# Patient Record
Sex: Male | Born: 1965
Health system: Southern US, Community
[De-identification: ages and names within clinical notes are randomized; demographics above are authoritative.]

## PROBLEM LIST (undated history)

## (undated) DIAGNOSIS — E291 Testicular hypofunction: Secondary | ICD-10-CM

## (undated) DIAGNOSIS — E785 Hyperlipidemia, unspecified: Secondary | ICD-10-CM

## (undated) DIAGNOSIS — Z87442 Personal history of urinary calculi: Secondary | ICD-10-CM

## (undated) HISTORY — DX: Hyperlipidemia, unspecified: E78.5

## (undated) HISTORY — DX: Testicular hypofunction: E29.1

## (undated) HISTORY — PX: OTHER SURGICAL HISTORY: SHX169

---

## 2012-07-09 ENCOUNTER — Other Ambulatory Visit: Payer: Self-pay | Admitting: *Deleted

## 2012-08-01 ENCOUNTER — Other Ambulatory Visit: Payer: Self-pay

## 2012-08-01 MED ORDER — ATORVASTATIN CALCIUM 40 MG PO TABS: 40.0000 mg | ORAL_TABLET | Freq: Every day | ORAL | Status: DC

## 2012-08-01 NOTE — Telephone Encounter (Signed)
.  .  .        xx

## 2012-08-30 ENCOUNTER — Telehealth: Payer: Self-pay | Admitting: Family Medicine

## 2012-08-30 ENCOUNTER — Ambulatory Visit: Payer: Self-pay | Admitting: Family Medicine

## 2012-08-30 NOTE — Telephone Encounter (Signed)
Left message for patient that a rescheduled appointment has been made for Monday morning at 8 am, if this is not convenient for him to call the office back and ask for me.

## 2012-08-30 NOTE — Telephone Encounter (Signed)
Call back from patient. Appointment time on Monday is good.

## 2012-09-03 ENCOUNTER — Encounter: Payer: Self-pay | Admitting: Family Medicine

## 2012-09-03 ENCOUNTER — Ambulatory Visit (INDEPENDENT_AMBULATORY_CARE_PROVIDER_SITE_OTHER): Payer: BC Managed Care – PPO | Admitting: Family Medicine

## 2012-09-03 VITALS — BP 128/77 | HR 70 | Temp 98.1°F | Wt 152.2 lb

## 2012-09-03 DIAGNOSIS — E785 Hyperlipidemia, unspecified: Secondary | ICD-10-CM

## 2012-09-03 DIAGNOSIS — R5381 Other malaise: Secondary | ICD-10-CM | POA: Insufficient documentation

## 2012-09-03 DIAGNOSIS — R5383 Other fatigue: Secondary | ICD-10-CM | POA: Insufficient documentation

## 2012-09-03 DIAGNOSIS — E782 Mixed hyperlipidemia: Secondary | ICD-10-CM | POA: Insufficient documentation

## 2012-09-03 LAB — POCT CBC
Granulocyte percent: 69 %G (ref 37–80)
HCT, POC: 44.1 % (ref 43.5–53.7)
Hemoglobin: 15.4 g/dL (ref 14.1–18.1)
Lymph, poc: 1.2 (ref 0.6–3.4)
MCH, POC: 30.3 pg (ref 27–31.2)
MCHC: 35 g/dL (ref 31.8–35.4)
MCV: 86.7 fL (ref 80–97)
MPV: 7.6 fL (ref 0–99.8)
POC Granulocyte: 3.1 (ref 2–6.9)
POC LYMPH PERCENT: 26.1 %L (ref 10–50)
Platelet Count, POC: 175 10*3/uL (ref 142–424)
RBC: 5.1 M/uL (ref 4.69–6.13)
RDW, POC: 13.9 %
WBC: 4.5 10*3/uL — AB (ref 4.6–10.2)

## 2012-09-03 LAB — COMPLETE METABOLIC PANEL WITH GFR
ALT: 26 U/L (ref 0–53)
AST: 19 U/L (ref 0–37)
Albumin: 3.9 g/dL (ref 3.5–5.2)
Alkaline Phosphatase: 76 U/L (ref 39–117)
BUN: 13 mg/dL (ref 6–23)
CO2: 29 mEq/L (ref 19–32)
Calcium: 9.7 mg/dL (ref 8.4–10.5)
Chloride: 104 mEq/L (ref 96–112)
Creat: 1.06 mg/dL (ref 0.50–1.35)
GFR, Est African American: >89 mL/min
GFR, Est Non African American: 84 mL/min
Glucose, Bld: 99 mg/dL (ref 70–99)
Potassium: 4.2 mEq/L (ref 3.5–5.3)
Sodium: 141 mEq/L (ref 135–145)
Total Bilirubin: 0.5 mg/dL (ref 0.3–1.2)
Total Protein: 6.7 g/dL (ref 6.0–8.3)

## 2012-09-03 LAB — TSH: TSH: 0.969 u[IU]/mL (ref 0.350–4.500)

## 2012-09-03 MED ORDER — PRAVASTATIN SODIUM 40 MG PO TABS: 40.0000 mg | ORAL_TABLET | Freq: Every day | ORAL | Status: DC

## 2012-09-03 NOTE — Progress Notes (Signed)
Patient ID: Jesse Thomas, male   DOB: Jul 08, 1965, 47 y.o.   MRN: [id number] BP 128/77  Pulse 70  Temp(Src) 98.1 F (36.7 C) (Oral)  Wt 152 lb 3.2 oz (69.037 kg) Male  Emmakate Hypes P. [place]esto Charon, M.D.

## 2012-09-03 NOTE — Progress Notes (Signed)
Patient ID: Jesse Thomas, male   DOB: 12-12-1965, 47 y.o.   MRN: [id number] SUBJECTIVE: HPI: Patient is here for follow up of hyperlipidemia:  denies Headache;denies Chest Pain;denies weakness;denies Shortness of Breath and orthopnea;denies Visual changes;denies palpitations;denies cough;denies pedal edema;denies symptoms of TIA or stroke;deniesClaudication symptoms. admits to Compliance with medications; Problems with medications.low libido  Stress at work.    PMH/PSH: reviewed/updated in Epic  SH/FH: reviewed/updated in Epic  Allergies: reviewed/updated in Epic  Medications: reviewed/updated in Epic  Immunizations: reviewed/updated in Epic  ROS: As above in the HPI. All other systems are stable or negative.  OBJECTIVE: APPEARANCE:  Patient in no acute distress.The patient appeared well nourished and normally developed. Acyanotic. Waist: VITAL SIGNS:  SKIN: warm and  Dry without overt rashes, tattoos and scars  HEAD and Neck: without JVD, Head and scalp: normal Eyes:No scleral icterus. Fundi normal, eye movements normal. Ears: Auricle normal, canal normal, Tympanic membranes normal, insufflation normal. Nose: normal Throat: normal Neck & thyroid: normal  CHEST & LUNGS: Chest wall: normal Lungs: Clear  CVS: Reveals the PMI to be normally located. Regular rhythm, First and Second Heart sounds are normal,  absence of murmurs, rubs or gallops. Peripheral vasculature: Radial pulses: normal Dorsal pedis pulses: normal Posterior pulses: normal  ABDOMEN:  Appearance: normal Benign, no organomegaly, no masses, no Abdominal Aortic enlargement. No Guarding , no rebound. No Bruits. Bowel sounds: normal  RECTAL: N/A GU: N/A  EXTREMETIES: nonedematous. Both Femoral and Pedal pulses are normal.  MUSCULOSKELETAL:  Spine: normal Joints: intact  NEUROLOGIC: oriented to time,place and person; nonfocal. Strength is normal Sensory is normal Reflexes are  normal Cranial Nerves are normal.  ASSESSMENT: HLD (hyperlipidemia) - Plan: pravastatin (PRAVACHOL) 40 MG tablet, COMPLETE METABOLIC PANEL WITH GFR, NMR Lipoprofile with Lipids, POCT CBC  Other malaise and fatigue - Plan: Testosterone, Total & Free Direct, TSH  PLAN:  Orders Placed This Encounter  Procedures  . COMPLETE METABOLIC PANEL WITH GFR  . NMR Lipoprofile with Lipids  . Testosterone, Total & Free Direct  . TSH  . POCT CBC   Meds ordered this encounter  Medications  . fish oil-omega-3 fatty acids 1000 MG capsule    Sig: Take 2 g by mouth daily.  . pravastatin (PRAVACHOL) 40 MG tablet    Sig: Take 1 tablet (40 mg total) by mouth daily.    Dispense:  30 tablet    Refill:  5   Start on aspirin 81 mg daily Changed his  Statin to see if symptoms of fatigue improves.  Consider Co Q 10.      Dr Woodroe Mode Recommendations  Diet and Exercise discussed with patient.  For nutrition information, I recommend books:  1).Eat to Live by Dr Monico Hoar. 2).Prevent and Reverse Heart Disease by Dr Suzzette Righter. 3) Dr Katherina Right Book: Reversing Diabetes  Exercise recommendations are:  If unable to walk, then the patient can exercise in a chair 3 times a day. By flapping arms like a bird gently and raising legs outwards to the front.  If ambulatory, the patient can go for walks for 30 minutes 3 times a week. Then increase the intensity and duration as tolerated.  Goal is to try to attain exercise frequency to 5 times a week.  If applicable: Best to perform resistance exercises (machines or weights) 2 days a week and cardio type exercises 3 days per week.  Return in about 4 months (around 01/03/2013) for Recheck medical problems.  Hamsa Laurich P. [place] Charon, M.D.

## 2012-09-03 NOTE — Patient Instructions (Signed)
      Dr Charmain Diosdado's Recommendations  Diet and Exercise discussed with patient.  For nutrition information, I recommend books:  1).Eat to Live by Dr Joel Fuhrman. 2).Prevent and Reverse Heart Disease by Dr Caldwell Esselstyn. 3) Dr Neal Barnard's Book: Reversing Diabetes  Exercise recommendations are:  If unable to walk, then the patient can exercise in a chair 3 times a day. By flapping arms like a bird gently and raising legs outwards to the front.  If ambulatory, the patient can go for walks for 30 minutes 3 times a week. Then increase the intensity and duration as tolerated.  Goal is to try to attain exercise frequency to 5 times a week.  If applicable: Best to perform resistance exercises (machines or weights) 2 days a week and cardio type exercises 3 days per week.  

## 2012-09-04 LAB — NMR LIPOPROFILE WITH LIPIDS
Cholesterol, Total: 113 mg/dL (ref <200)
HDL Particle Number: 28.2 umol/L — ABNORMAL LOW (ref >=30.5)
HDL Size: 8.5 nm — ABNORMAL LOW (ref >=9.2)
HDL-C: 34 mg/dL — ABNORMAL LOW (ref >=40)
LDL (calc): 40 mg/dL (ref <100)
LDL Particle Number: 922 nmol/L (ref <1000)
LDL Size: 20.4 nm — ABNORMAL LOW (ref >20.5)
LP-IR Score: 82 — ABNORMAL HIGH (ref <=45)
Large HDL-P: 1.4 umol/L — ABNORMAL LOW (ref >=4.8)
Large VLDL-P: 6.7 nmol/L — ABNORMAL HIGH (ref <=2.7)
Small LDL Particle Number: 535 nmol/L — ABNORMAL HIGH (ref <=527)
Triglycerides: 195 mg/dL — ABNORMAL HIGH (ref <150)
VLDL Size: 54.4 nm — ABNORMAL HIGH (ref <=46.6)

## 2012-09-05 ENCOUNTER — Telehealth: Payer: Self-pay | Admitting: Family Medicine

## 2012-09-05 ENCOUNTER — Other Ambulatory Visit: Payer: Self-pay | Admitting: Family Medicine

## 2012-09-05 ENCOUNTER — Encounter: Payer: Self-pay | Admitting: Family Medicine

## 2012-09-05 DIAGNOSIS — E291 Testicular hypofunction: Secondary | ICD-10-CM

## 2012-09-05 LAB — TESTOSTERONE, TOTAL AND FREE DIRECT MEASURE
Free Testosterone, Direct: 2.9 pg/mL — ABNORMAL LOW (ref 3.8–34.2)
Testosterone: 269 ng/dL — ABNORMAL LOW (ref 300–890)

## 2012-09-06 ENCOUNTER — Telehealth: Payer: Self-pay | Admitting: Family Medicine

## 2012-09-06 NOTE — Telephone Encounter (Signed)
Pt notified via phone 

## 2012-09-06 NOTE — Telephone Encounter (Signed)
Pt notified and copy labs mailed at his request

## 2012-09-06 NOTE — Telephone Encounter (Signed)
Pt notififed  Lab results given and copy mailed to pt Duplicate mess

## 2012-09-06 NOTE — Telephone Encounter (Signed)
Pt notiifed of lab results

## 2013-01-31 ENCOUNTER — Other Ambulatory Visit: Payer: Self-pay

## 2013-05-22 ENCOUNTER — Other Ambulatory Visit: Payer: Self-pay | Admitting: Family Medicine

## 2013-05-23 NOTE — Telephone Encounter (Signed)
Patient needs to be seen. Has exceeded time since last visit. Limited quantity refilled. Needs to bring all medications to next appointment.   

## 2013-06-24 ENCOUNTER — Encounter: Payer: Self-pay | Admitting: Family Medicine

## 2013-06-24 ENCOUNTER — Ambulatory Visit (INDEPENDENT_AMBULATORY_CARE_PROVIDER_SITE_OTHER): Payer: BC Managed Care – PPO | Admitting: Family Medicine

## 2013-06-24 VITALS — BP 122/80 | HR 74 | Temp 98.1°F | Ht 66.0 in | Wt 152.6 lb

## 2013-06-24 DIAGNOSIS — E785 Hyperlipidemia, unspecified: Secondary | ICD-10-CM

## 2013-06-24 MED ORDER — PRAVASTATIN SODIUM 40 MG PO TABS: ORAL_TABLET | ORAL | Status: DC

## 2013-06-24 NOTE — Patient Instructions (Signed)
Hypercholesterolemia High Blood Cholesterol Cholesterol is a white, waxy, fat-like protein needed by your body in small amounts. The liver makes all the cholesterol you need. It is carried from the liver by the blood through the blood vessels. Deposits (plaque) may build up on blood vessel walls. This makes the arteries narrower and stiffer. Plaque increases the risk for heart attack and stroke. You cannot feel your cholesterol level even if it is very high. The only way to know is by a blood test to check your lipid (fats) levels. Once you know your cholesterol levels, you should keep a record of the test results. Work with your caregiver to to keep your levels in the desired range. WHAT THE RESULTS MEAN:  Total cholesterol is a rough measure of all the cholesterol in your blood.  LDL is the so-called bad cholesterol. This is the type that deposits cholesterol in the walls of the arteries. You want this level to be low.  HDL is the good cholesterol because it cleans the arteries and carries the LDL away. You want this level to be high.  Triglycerides are fat that the body can either burn for energy or store. High levels are closely linked to heart disease. DESIRED LEVELS:  Total cholesterol below 200.  LDL below 100 for people at risk, below 70 for very high risk.  HDL above 50 is good, above 60 is best.  Triglycerides below 150. HOW TO LOWER YOUR CHOLESTEROL:  Diet.  Choose fish or white meat chicken and [place], roasted or baked. Limit fatty cuts of red meat, fried foods, and processed meats, such as sausage and lunch meat.  Eat lots of fresh fruits and vegetables. Choose whole grains, beans, pasta, potatoes and cereals.  Use only small amounts of olive, corn or canola oils. Avoid butter, mayonnaise, shortening or palm kernel oils. Avoid foods with trans-fats.  Use skim/nonfat milk and low-fat/nonfat yogurt and cheeses. Avoid whole milk, cream, ice cream, egg yolks and cheeses.  Healthy desserts include angel food cake, gingersnaps, animal crackers, hard candy, popsicles, and low-fat/nonfat frozen yogurt. Avoid pastries, cakes, pies and cookies.  Exercise.  A regular program helps decrease LDL and raises HDL.  Helps with weight control.  Do things that increase your activity level like gardening, walking, or taking the stairs.  Medication.  May be prescribed by your caregiver to help lowering cholesterol and the risk for heart disease.  You may need medicine even if your levels are normal if you have several risk factors. HOME CARE INSTRUCTIONS   Follow your diet and exercise programs as suggested by your caregiver.  Take medications as directed.  Have blood work done when your caregiver feels it is necessary. MAKE SURE YOU:   Understand these instructions.  Will watch your condition.  Will get help right away if you are not doing well or get worse. Document Released: 03/14/2005 Document Revised: 06/06/2011 Document Reviewed: 08/30/2006 [facility] Patient Information 2014 [place].        Dr Paula Libra Recommendations  For nutrition information, I recommend books:  1).Eat to Live by Dr Excell Seltzer. 2).Prevent and Reverse Heart Disease by Dr Karl Luke. 3) Dr Janene Harvey Book:  Program to Reverse Diabetes  Exercise recommendations are:  If unable to walk, then the patient can exercise in a chair 3 times a day. By flapping arms like a bird gently and raising legs outwards to the front.  If ambulatory, the patient can go for walks for 30 minutes 3 times  week. Then increase the intensity and duration as tolerated.  Goal is to try to attain exercise frequency to 5 times a week.  If applicable: Best to perform resistance exercises (machines or weights) 2 days a week and cardio type exercises 3 days per week.  

## 2013-06-24 NOTE — Progress Notes (Signed)
Patient ID: Jesse Thomas, male   DOB: 1965-10-25, 48 y.o.   MRN: [id number] SUBJECTIVE: CC: Chief Complaint  Patient presents with  . Follow-up    needs refills    HPI: Patient is here for follow up of hyperlipidemia: denies Headache;denies Chest Pain;denies weakness;denies Shortness of Breath and orthopnea;denies Visual changes;denies palpitations;denies cough;denies pedal edema;denies symptoms of TIA or stroke;deniesClaudication symptoms. admits to Compliance with medications; denies Problems with medications.  Has been out of medications for 2 weeks. Past Medical History  Diagnosis Date  . Hyperlipidemia    No past surgical history on file. History   Social History  . Marital Status: Married    Spouse Name: N/A    Number of Children: N/A  . Years of Education: N/A   Occupational History  . Not on file.   Social History Main Topics  . Smoking status: Never Smoker   . Smokeless tobacco: Not on file  . Alcohol Use: Not on file  . Drug Use: Not on file  . Sexual Activity: Not on file   Other Topics Concern  . Not on file   Social History Narrative  . No narrative on file   No family history on file. Current Outpatient Prescriptions on File Prior to Visit  Medication Sig Dispense Refill  . fish oil-omega-3 fatty acids 1000 MG capsule Take 2 g by mouth daily.       No current facility-administered medications on file prior to visit.   No Known Allergies  There is no immunization history on file for this patient. Prior to Admission medications   Medication Sig Start Date End Date Taking? Authorizing Provider  fish oil-omega-3 fatty acids 1000 MG capsule Take 2 g by mouth daily.    Historical Provider, MD  pravastatin (PRAVACHOL) 40 MG tablet TAKE ONE TABLET BY MOUTH ONCE DAILY    Vernie Shanks, MD     ROS: As above in the HPI. All other systems are stable or negative.  OBJECTIVE: APPEARANCE:  Patient in no acute distress.The patient appeared well  nourished and normally developed. Acyanotic. Waist: VITAL SIGNS:BP 122/80  Pulse 74  Temp(Src) 98.1 F (36.7 C) (Oral)  Ht _0  (1.676 m)  Wt 152 lb 9.6 oz (69.219 kg)  BMI 24.64 kg/m2  [place] Male.  SKIN: warm and  Dry without overt rashes, tattoos and scars  HEAD and Neck: without JVD, Head and scalp: normal Eyes:No scleral icterus. Fundi normal, eye movements normal. Ears: Auricle normal, canal normal, Tympanic membranes normal, insufflation normal. Nose: normal Throat: normal Neck & thyroid: normal  CHEST & LUNGS: Chest wall: normal Lungs: Clear  CVS: Reveals the PMI to be normally located. Regular rhythm, First and Second Heart sounds are normal,  absence of murmurs, rubs or gallops. Peripheral vasculature: Radial pulses: normal Dorsal pedis pulses: normal Posterior pulses: normal  ABDOMEN:  Appearance: normal Benign, no organomegaly, no masses, no Abdominal Aortic enlargement. No Guarding , no rebound. No Bruits. Bowel sounds: normal  RECTAL: N/A GU: N/A  EXTREMETIES: nonedematous.  MUSCULOSKELETAL:  Spine: normal Joints: intact  NEUROLOGIC: oriented to time,place and person; nonfocal. Strength is normal Sensory is normal Reflexes are normal Cranial Nerves are normal.  Results for orders placed in visit on 09/03/12  COMPLETE METABOLIC PANEL WITH GFR      Result Value Ref Range   Sodium 141  135 - 145 mEq/L   Potassium 4.2  3.5 - 5.3 mEq/L   Chloride 104  96 - 112 mEq/L  CO2 29  19 - 32 mEq/L   Glucose, Bld 99  70 - 99 mg/dL   BUN 13  6 - 23 mg/dL   Creat 1.06  0.50 - 1.35 mg/dL   Total Bilirubin 0.5  0.3 - 1.2 mg/dL   Alkaline Phosphatase 76  39 - 117 U/L   AST 19  0 - 37 U/L   ALT 26  0 - 53 U/L   Total Protein 6.7  6.0 - 8.3 g/dL   Albumin 3.9  3.5 - 5.2 g/dL   Calcium 9.7  8.4 - 10.5 mg/dL   GFR, Est African American >89     GFR, Est Non African American 84    NMR LIPOPROFILE WITH LIPIDS      Result Value Ref Range   LDL  Particle Number 922  <1000 nmol/L   LDL (calc) 40  <100 mg/dL   HDL-C 34 (*) >=40 mg/dL   Triglycerides 195 (*) <150 mg/dL   Cholesterol, Total 113  <200 mg/dL   HDL Particle Number 28.2 (*) >=30.5 umol/L   Large HDL-P 1.4 (*) >=4.8 umol/L   Large VLDL-P 6.7 (*) <=2.7 nmol/L   Small LDL Particle Number 535 (*) <=527 nmol/L   LDL Size 20.4 (*) >20.5 nm   HDL Size 8.5 (*) >=9.2 nm   VLDL Size 54.4 (*) <=46.6 nm   LP-IR Score 82 (*) <=45  TESTOSTERONE, TOTAL AND FREE DIRECT MEASURE      Result Value Ref Range   Testosterone 269 (*) 300 - 890 ng/dL   Free Testosterone, Direct 2.9 (*) 3.8 - 34.2 pg/mL  TSH      Result Value Ref Range   TSH 0.969  0.350 - 4.500 uIU/mL  POCT CBC      Result Value Ref Range   WBC 4.5 (*) 4.6 - 10.2 K/uL   Lymph, poc 1.2  0.6 - 3.4   POC LYMPH PERCENT 26.1  10 - 50 %L   POC Granulocyte 3.1  2 - 6.9   Granulocyte percent 69.0  37 - 80 %G   RBC 5.1  4.69 - 6.13 M/uL   Hemoglobin 15.4  14.1 - 18.1 g/dL   HCT, POC 44.1  43.5 - 53.7 %   MCV 86.7  80 - 97 fL   MCH, POC 30.3  27 - 31.2 pg   MCHC 35.0  31.8 - 35.4 g/dL   RDW, POC 13.9     Platelet Count, POC 175.0  142 - 424 K/uL   MPV 7.6  0 - 99.8 fL    ASSESSMENT:  Hyperlipidemia - Plan: pravastatin (PRAVACHOL) 40 MG tablet, CMP14+EGFR, NMR, lipoprofile  PLAN: Handout on hypercholesterolemia in the AVS.       Dr Paula Libra Recommendations  For nutrition information, I recommend books:  1).Eat to Live by Dr Excell Seltzer. 2).Prevent and Reverse Heart Disease by Dr Karl Luke. 3) Dr Janene Harvey Book:  Program to Reverse Diabetes  Exercise recommendations are:  If unable to walk, then the patient can exercise in a chair 3 times a day. By flapping arms like a bird gently and raising legs outwards to the front.  If ambulatory, the patient can go for walks for 30 minutes 3 times a week. Then increase the intensity and duration as tolerated.  Goal is to try to attain exercise  frequency to 5 times a week.  If applicable: Best to perform resistance exercises (machines or weights) 2 days a week and cardio type exercises  3 days per week.  Orders Placed This Encounter  Procedures  . CMP14+EGFR  . NMR, lipoprofile   Meds ordered this encounter  Medications  . pravastatin (PRAVACHOL) 40 MG tablet    Sig: TAKE ONE TABLET BY MOUTH ONCE DAILY    Dispense:  30 tablet    Refill:  5   Medications Discontinued During This Encounter  Medication Reason  . pravastatin (PRAVACHOL) 40 MG tablet Reorder   Return in about 4 months (around 10/24/2013) for Recheck medical problems.  Shalinda Burkholder P. Jacelyn Grip, M.D.

## 2013-06-25 LAB — NMR, LIPOPROFILE
Cholesterol: 164 mg/dL (ref <200)
HDL Cholesterol by NMR: 36 mg/dL — ABNORMAL LOW (ref >=40)
HDL Particle Number: 30.6 umol/L (ref >=30.5)
LDL Particle Number: 1389 nmol/L — ABNORMAL HIGH (ref <1000)
LDL Size: 20.6 nm (ref >20.5)
LDLC SERPL CALC-MCNC: 90 mg/dL (ref <100)
LP-IR Score: 73 — ABNORMAL HIGH (ref <=45)
Small LDL Particle Number: 761 nmol/L — ABNORMAL HIGH (ref <=527)
Triglycerides by NMR: 191 mg/dL — ABNORMAL HIGH (ref <150)

## 2013-06-25 LAB — CMP14+EGFR
ALT: 25 IU/L (ref 0–44)
AST: 19 IU/L (ref 0–40)
Albumin/Globulin Ratio: 2 (ref 1.1–2.5)
Albumin: 4.4 g/dL (ref 3.5–5.5)
Alkaline Phosphatase: 78 IU/L (ref 39–117)
BUN/Creatinine Ratio: 11 (ref 9–20)
BUN: 14 mg/dL (ref 6–24)
CO2: 26 mmol/L (ref 18–29)
Calcium: 9.4 mg/dL (ref 8.7–10.2)
Chloride: 104 mmol/L (ref 97–108)
Creatinine, Ser: 1.22 mg/dL (ref 0.76–1.27)
GFR calc Af Amer: 82 mL/min/{1.73_m2} (ref >59)
GFR calc non Af Amer: 71 mL/min/{1.73_m2} (ref >59)
Globulin, Total: 2.2 g/dL (ref 1.5–4.5)
Glucose: 91 mg/dL (ref 65–99)
Potassium: 4.1 mmol/L (ref 3.5–5.2)
Sodium: 145 mmol/L — ABNORMAL HIGH (ref 134–144)
Total Bilirubin: 0.3 mg/dL (ref 0.0–1.2)
Total Protein: 6.6 g/dL (ref 6.0–8.5)

## 2013-06-26 NOTE — Progress Notes (Signed)
Quick Note:  Call Patient Labs that are abnormal: Triglycerides and the LDL particles was a little high HDLc is low The rest are at goal  Recommendations: This is not so bad as i was expecting since he ran out of medications for 2 weeks. He needs to keep on th emedications and keep up with apppintments. Diet and exercise will always help.   ______

## 2013-06-27 ENCOUNTER — Telehealth: Payer: Self-pay | Admitting: Family Medicine

## 2013-06-27 NOTE — Telephone Encounter (Signed)
Discussed with patient and released to My Chart.

## 2013-09-04 ENCOUNTER — Ambulatory Visit (INDEPENDENT_AMBULATORY_CARE_PROVIDER_SITE_OTHER): Payer: BC Managed Care – PPO | Admitting: Family Medicine

## 2013-09-04 ENCOUNTER — Encounter: Payer: Self-pay | Admitting: Family Medicine

## 2013-09-04 VITALS — BP 121/86 | HR 69 | Temp 97.9°F | Wt 153.0 lb

## 2013-09-04 DIAGNOSIS — J029 Acute pharyngitis, unspecified: Secondary | ICD-10-CM

## 2013-09-04 MED ORDER — AZITHROMYCIN 250 MG PO TABS: ORAL_TABLET | ORAL | Status: DC

## 2013-09-04 NOTE — Progress Notes (Signed)
   Subjective:    Patient ID: Jesse Thomas, male    DOB: 04/26/1965, 48 y.o.   MRN: [id number]  HPI This 48 y.o. male presents for evaluation of uri sx's for a week.   Review of Systems No chest pain, SOB, HA, dizziness, vision change, N/V, diarrhea, constipation, dysuria, urinary urgency or frequency, myalgias, arthralgias or rash.     Objective:   Physical Exam   Vital signs noted  Well developed well nourished male.  HEENT - Head atraumatic Normocephalic                Eyes - PERRLA, Conjuctiva - clear Sclera- Clear EOMI                Ears - EAC's Wnl TM's Wnl Gross Hearing WNL                Nose - Nares patent                 Throat - oropharanx wnl Respiratory - Lungs CTA bilateral Cardiac - RRR S1 and S2 without murmur GI - Abdomen soft Nontender and bowel sounds active x 4 Extremities - No edema. Neuro - Grossly intact.     Assessment & Plan:  Acute pharyngitis - Plan: POCT rapid strep A, azithromycin (ZITHROMAX) 250 MG tablet Push po fluids, rest, tylenol and motrin otc prn as directed for fever, arthralgias, and myalgias.  Follow up prn if sx's continue or persist.  Lysbeth Penner FNP

## 2016-02-17 ENCOUNTER — Ambulatory Visit (INDEPENDENT_AMBULATORY_CARE_PROVIDER_SITE_OTHER): Payer: BLUE CROSS/BLUE SHIELD | Admitting: Family Medicine

## 2016-02-17 ENCOUNTER — Encounter: Payer: Self-pay | Admitting: Family Medicine

## 2016-02-17 VITALS — BP 102/68 | HR 61 | Temp 97.4°F | Ht 66.0 in | Wt 153.4 lb

## 2016-02-17 DIAGNOSIS — N529 Male erectile dysfunction, unspecified: Secondary | ICD-10-CM

## 2016-02-17 DIAGNOSIS — Z0000 Encounter for general adult medical examination without abnormal findings: Secondary | ICD-10-CM | POA: Diagnosis not present

## 2016-02-17 DIAGNOSIS — E782 Mixed hyperlipidemia: Secondary | ICD-10-CM

## 2016-02-17 LAB — URINALYSIS
Bilirubin, UA: NEGATIVE
GLUCOSE, UA: NEGATIVE
KETONES UA: NEGATIVE
Leukocytes, UA: NEGATIVE
Nitrite, UA: NEGATIVE
Protein, UA: NEGATIVE
Specific Gravity, UA: 1.01 (ref 1.005–1.030)
UUROB: 0.2 mg/dL (ref 0.2–1.0)
pH, UA: 7 (ref 5.0–7.5)

## 2016-02-17 MED ORDER — SILDENAFIL CITRATE 20 MG PO TABS: 20.0000 mg | ORAL_TABLET | Freq: Every day | ORAL | 5 refills | Status: DC | PRN

## 2016-02-17 NOTE — Progress Notes (Signed)
Subjective:  Patient ID: Jesse Thomas, male    DOB: 07-24-1965  Age: 50 y.o. MRN: [id number]  CC: Annual Exam   HPI Jermale Kasper presents for Annual physical exam. History of low testosterone. He decided against taking testosterone replacement due to side effects when he read up.He did not give them a trial. However he would like to give Viagra another try. He states that he had headaches when he took it in the past. Patient also did not take the pravastatin since it also was associated with side effects. He did not experience any side effects because he didn't try the medication.   History Maris has a past medical history of Hyperlipidemia and Hypogonadism male.   He has a past surgical history that includes none.   His family history includes Dementia in his mother; Early death in his father.He reports that he has never smoked. He has never used smokeless tobacco. His alcohol and drug histories are not on file.    Medication List       Accurate as of 02/17/16 11:36 AM. Always use your most recent med list.          fish oil-omega-3 fatty acids 1000 MG capsule Take 2 g by mouth daily.   sildenafil 20 MG tablet Commonly known as:  REVATIO Take 1 tablet (20 mg total) by mouth daily as needed (2-5 as needed).        ROS Review of Systems  Constitutional: Negative for activity change, appetite change, chills, diaphoresis, fatigue, fever and unexpected weight change.  HENT: Negative for congestion, ear pain, hearing loss, postnasal drip, rhinorrhea, sore throat, tinnitus and trouble swallowing.   Eyes: Negative for photophobia, pain, discharge and redness.  Respiratory: Negative for apnea, cough, choking, chest tightness, shortness of breath, wheezing and stridor.   Cardiovascular: Negative for chest pain, palpitations and leg swelling.  Gastrointestinal: Negative for abdominal distention, abdominal pain, blood in stool, constipation, diarrhea, nausea and vomiting.    Endocrine: Negative for cold intolerance, heat intolerance, polydipsia, polyphagia and polyuria.  Genitourinary: Negative for difficulty urinating, dysuria, enuresis, flank pain, frequency, genital sores, hematuria and urgency.  Musculoskeletal: Negative for arthralgias and joint swelling.  Skin: Negative for color change, rash and wound.  Allergic/Immunologic: Negative for immunocompromised state.  Neurological: Negative for dizziness, tremors, seizures, syncope, facial asymmetry, speech difficulty, weakness, light-headedness, numbness and headaches.  Hematological: Does not bruise/bleed easily.  Psychiatric/Behavioral: Negative for agitation, behavioral problems, confusion, decreased concentration, dysphoric mood, hallucinations, sleep disturbance and suicidal ideas. The patient is not nervous/anxious and is not hyperactive.     Objective:  BP 102/68   Pulse 61   Temp 97.4 F (36.3 C) (Oral)   Ht '5\' 6"'  (1.676 m)   Wt 153 lb 6 oz (69.6 kg)   BMI 24.76 kg/m   BP Readings from Last 3 Encounters:  02/17/16 102/68  09/04/13 121/86  06/24/13 122/80    Wt Readings from Last 3 Encounters:  02/17/16 153 lb 6 oz (69.6 kg)  09/04/13 153 lb (69.4 kg)  06/24/13 152 lb 9.6 oz (69.2 kg)     Physical Exam  Constitutional: He is oriented to person, place, and time. He appears well-developed and well-nourished.  HENT:  Head: Normocephalic and atraumatic.  Mouth/Throat: Oropharynx is clear and moist.  Eyes: EOM are normal. Pupils are equal, round, and reactive to light.  Neck: Normal range of motion. No tracheal deviation present. No thyromegaly present.  Cardiovascular: Normal rate, regular rhythm and normal heart sounds.  Exam reveals no gallop and no friction rub.   No murmur heard. Pulmonary/Chest: Breath sounds normal. He has no wheezes. He has no rales.  Abdominal: Soft. He exhibits no mass. There is no tenderness.  Musculoskeletal: Normal range of motion. He exhibits no edema.   Neurological: He is alert and oriented to person, place, and time.  Skin: Skin is warm and dry.  Psychiatric: He has a normal mood and affect.     Lab Results  Component Value Date   WBC 4.5 (A) 09/03/2012   HGB 15.4 09/03/2012   HCT 44.1 09/03/2012   GLUCOSE 91 06/24/2013   CHOL 164 06/24/2013   TRIG 191 (H) 06/24/2013   HDL 36 (L) 06/24/2013   LDLCALC 90 06/24/2013   ALT 25 06/24/2013   AST 19 06/24/2013   NA 145 (H) 06/24/2013   K 4.1 06/24/2013   CL 104 06/24/2013   CREATININE 1.22 06/24/2013   BUN 14 06/24/2013   CO2 26 06/24/2013   TSH 0.969 09/03/2012    Patient was never admitted.  Assessment & Plan:   Hamdan was seen today for annual exam.  Diagnoses and all orders for this visit:  Well adult exam -     CBC with Differential/Platelet -     CMP14+EGFR -     Lipid panel -     PSA Total (Reflex To Free) -     Urinalysis  Erectile dysfunction, unspecified erectile dysfunction type  Mixed hyperlipidemia  Other orders -     sildenafil (REVATIO) 20 MG tablet; Take 1 tablet (20 mg total) by mouth daily as needed (2-5 as needed).    Meds ordered this encounter  Medications  . sildenafil (REVATIO) 20 MG tablet    Sig: Take 1 tablet (20 mg total) by mouth daily as needed (2-5 as needed).    Dispense:  50 tablet    Refill:  5     Follow-up: Return in about 1 year (around 02/16/2017).  Claretta Fraise, M.D.

## 2016-02-18 LAB — CMP14+EGFR
ALK PHOS: 83 IU/L (ref 39–117)
ALT: 24 IU/L (ref 0–44)
AST: 19 IU/L (ref 0–40)
Albumin/Globulin Ratio: 1.6 (ref 1.2–2.2)
Albumin: 4.5 g/dL (ref 3.5–5.5)
BILIRUBIN TOTAL: 0.7 mg/dL (ref 0.0–1.2)
BUN/Creatinine Ratio: 13 (ref 9–20)
BUN: 15 mg/dL (ref 6–24)
CHLORIDE: 101 mmol/L (ref 96–106)
CO2: 27 mmol/L (ref 18–29)
Calcium: 9.5 mg/dL (ref 8.7–10.2)
Creatinine, Ser: 1.15 mg/dL (ref 0.76–1.27)
GFR calc Af Amer: 87 mL/min/{1.73_m2} (ref >59)
GFR calc non Af Amer: 75 mL/min/{1.73_m2} (ref >59)
GLOBULIN, TOTAL: 2.8 g/dL (ref 1.5–4.5)
Glucose: 79 mg/dL (ref 65–99)
POTASSIUM: 4.2 mmol/L (ref 3.5–5.2)
Sodium: 143 mmol/L (ref 134–144)
Total Protein: 7.3 g/dL (ref 6.0–8.5)

## 2016-02-18 LAB — LIPID PANEL
CHOLESTEROL TOTAL: 210 mg/dL — AB (ref 100–199)
Chol/HDL Ratio: 5.4 ratio units — ABNORMAL HIGH (ref 0.0–5.0)
HDL: 39 mg/dL — ABNORMAL LOW (ref >39)
LDL Calculated: 150 mg/dL — ABNORMAL HIGH (ref 0–99)
Triglycerides: 107 mg/dL (ref 0–149)
VLDL Cholesterol Cal: 21 mg/dL (ref 5–40)

## 2016-02-18 LAB — CBC WITH DIFFERENTIAL/PLATELET
BASOS ABS: 0 10*3/uL (ref 0.0–0.2)
Basos: 1 %
EOS (ABSOLUTE): 0.2 10*3/uL (ref 0.0–0.4)
Eos: 5 %
HEMOGLOBIN: 15 g/dL (ref 12.6–17.7)
Hematocrit: 41.7 % (ref 37.5–51.0)
Immature Grans (Abs): 0 10*3/uL (ref 0.0–0.1)
Immature Granulocytes: 0 %
LYMPHS ABS: 1 10*3/uL (ref 0.7–3.1)
Lymphs: 33 %
MCH: 29.7 pg (ref 26.6–33.0)
MCHC: 36 g/dL — AB (ref 31.5–35.7)
MCV: 83 fL (ref 79–97)
Monocytes Absolute: 0.4 10*3/uL (ref 0.1–0.9)
Monocytes: 14 %
NEUTROS ABS: 1.5 10*3/uL (ref 1.4–7.0)
Neutrophils: 47 %
Platelets: 171 10*3/uL (ref 150–379)
RBC: 5.05 x10E6/uL (ref 4.14–5.80)
RDW: 14.5 % (ref 12.3–15.4)
WBC: 3.1 10*3/uL — ABNORMAL LOW (ref 3.4–10.8)

## 2016-02-18 LAB — PSA TOTAL (REFLEX TO FREE): Prostate Specific Ag, Serum: 1.1 ng/mL (ref 0.0–4.0)

## 2016-02-19 ENCOUNTER — Other Ambulatory Visit: Payer: Self-pay | Admitting: *Deleted

## 2016-02-19 ENCOUNTER — Telehealth: Payer: Self-pay | Admitting: Family Medicine

## 2016-02-19 DIAGNOSIS — R319 Hematuria, unspecified: Secondary | ICD-10-CM

## 2016-02-19 NOTE — Telephone Encounter (Signed)
Patient aware of results and are viewable via MyChart

## 2016-02-23 ENCOUNTER — Telehealth: Payer: Self-pay | Admitting: Family Medicine

## 2016-02-23 NOTE — Telephone Encounter (Signed)
We will release this

## 2016-08-04 DIAGNOSIS — M25562 Pain in left knee: Secondary | ICD-10-CM | POA: Diagnosis not present

## 2016-08-13 DIAGNOSIS — M25562 Pain in left knee: Secondary | ICD-10-CM | POA: Diagnosis not present

## 2016-09-01 DIAGNOSIS — M25562 Pain in left knee: Secondary | ICD-10-CM | POA: Diagnosis not present

## 2016-09-01 DIAGNOSIS — S83241D Other tear of medial meniscus, current injury, right knee, subsequent encounter: Secondary | ICD-10-CM | POA: Diagnosis not present

## 2016-11-01 DIAGNOSIS — G44219 Episodic tension-type headache, not intractable: Secondary | ICD-10-CM | POA: Diagnosis not present

## 2016-11-12 DIAGNOSIS — G44219 Episodic tension-type headache, not intractable: Secondary | ICD-10-CM | POA: Diagnosis not present

## 2016-11-25 DIAGNOSIS — G44219 Episodic tension-type headache, not intractable: Secondary | ICD-10-CM | POA: Diagnosis not present

## 2017-06-26 ENCOUNTER — Encounter: Payer: BLUE CROSS/BLUE SHIELD | Admitting: Family Medicine

## 2017-06-26 ENCOUNTER — Encounter: Payer: Self-pay | Admitting: Family Medicine

## 2017-06-26 ENCOUNTER — Ambulatory Visit (INDEPENDENT_AMBULATORY_CARE_PROVIDER_SITE_OTHER): Payer: BLUE CROSS/BLUE SHIELD | Admitting: Family Medicine

## 2017-06-26 VITALS — BP 109/65 | HR 72 | Temp 97.8°F | Ht 66.0 in | Wt 148.1 lb

## 2017-06-26 DIAGNOSIS — E782 Mixed hyperlipidemia: Secondary | ICD-10-CM

## 2017-06-26 DIAGNOSIS — Z0000 Encounter for general adult medical examination without abnormal findings: Secondary | ICD-10-CM

## 2017-06-26 DIAGNOSIS — Z23 Encounter for immunization: Secondary | ICD-10-CM

## 2017-06-26 DIAGNOSIS — Z125 Encounter for screening for malignant neoplasm of prostate: Secondary | ICD-10-CM

## 2017-06-26 DIAGNOSIS — Z1211 Encounter for screening for malignant neoplasm of colon: Secondary | ICD-10-CM

## 2017-06-26 DIAGNOSIS — E291 Testicular hypofunction: Secondary | ICD-10-CM | POA: Insufficient documentation

## 2017-06-26 DIAGNOSIS — Z1321 Encounter for screening for nutritional disorder: Secondary | ICD-10-CM

## 2017-06-26 MED ORDER — TRAZODONE HCL 150 MG PO TABS: ORAL_TABLET | ORAL | 5 refills | Status: DC

## 2017-06-26 NOTE — Progress Notes (Addendum)
Subjective:  Patient ID: Jesse Thomas, male    DOB: December 03, 1965  Age: 52 y.o. MRN: [id number]  CC: Annual Exam   HPI Jesse Thomas presents for complete physical examination.  He is concerned that he is not sleeping well at night.  He goes to bed about 12 AM.  He awakens at 10 through the week.  However he has to get up at 6 AM on the weekends.  He will awaken at 2:58 in the morning and just does for the rest of the night.  He is tried Ambien in the past is interested in that but when we discussed that it can be habit-forming he is willing to try trazodone.  He expresses concerns about side effects of multiple medicines including the cholesterol medicine.  He does not want to go on cholesterol medicine but he is using 3 fish oil tablets daily.  He has looked at AndroGel in the past for his testosterone and was very worried about the side effects and did not try the medication.  He is unable to have erections.  Depression screen [facility] 2/9 06/26/2017 02/17/2016  Decreased Interest 0 0  Down, Depressed, Hopeless 0 0  PHQ - 2 Score 0 0    History Jesse Thomas has a past medical history of Hyperlipidemia and Hypogonadism male.   He has a past surgical history that includes none.   His family history includes Dementia in his mother; Early death in his father.He reports that he has never smoked. He has never used smokeless tobacco. His alcohol and drug histories are not on file.    ROS Review of Systems  Constitutional: Negative for activity change, appetite change and fatigue.  HENT: Negative.   Eyes: Negative.   Respiratory: Negative for cough, chest tightness and shortness of breath.   Cardiovascular: Negative for chest pain and leg swelling.  Gastrointestinal: Negative for abdominal pain, constipation, diarrhea, nausea and vomiting.  Genitourinary: Negative for decreased urine volume, difficulty urinating, dysuria and frequency.  Musculoskeletal: Negative.   Skin: Negative for rash and wound.    Neurological: Negative for weakness and headaches.  Psychiatric/Behavioral: Positive for sleep disturbance. Negative for dysphoric mood.    Objective:  BP 109/65   Pulse 72   Temp 97.8 F (36.6 C) (Oral)   Ht '5\' 6"'  (1.676 m)   Wt 148 lb 2 oz (67.2 kg)   BMI 23.91 kg/m   BP Readings from Last 3 Encounters:  06/26/17 109/65  02/17/16 102/68  09/04/13 121/86    Wt Readings from Last 3 Encounters:  06/26/17 148 lb 2 oz (67.2 kg)  02/17/16 153 lb 6 oz (69.6 kg)  09/04/13 153 lb (69.4 kg)     Physical Exam  Constitutional: He is oriented to person, place, and time. He appears well-developed and well-nourished.  HENT:  Head: Normocephalic and atraumatic.  Mouth/Throat: Oropharynx is clear and moist.  Eyes: Pupils are equal, round, and reactive to light. EOM are normal.  Neck: Normal range of motion. No tracheal deviation present. No thyromegaly present.  Cardiovascular: Normal rate, regular rhythm and normal heart sounds. Exam reveals no gallop and no friction rub.  No murmur heard. Pulmonary/Chest: Breath sounds normal. He has no wheezes. He has no rales.  Abdominal: Soft. He exhibits no mass. There is no tenderness. Hernia confirmed negative in the right inguinal area and confirmed negative in the left inguinal area.  Genitourinary: Testes normal and penis normal. Right testis shows no mass. Left testis shows no mass. Circumcised.  Musculoskeletal: Normal range of motion. He exhibits no edema.  Neurological: He is alert and oriented to person, place, and time.  Skin: Skin is warm and dry.  Psychiatric: He has a normal mood and affect.      Assessment & Plan:   Jesse Thomas was seen today for annual exam.  Diagnoses and all orders for this visit:  Well adult exam -     CBC with Differential/Platelet -     CMP14+EGFR -     TSH -     Urinalysis  Mixed hyperlipidemia -     CBC with Differential/Platelet -     CMP14+EGFR -     Lipid panel -     TSH  Hypogonadism in  male -     CBC with Differential/Platelet -     CMP14+EGFR -     TSH -     Urinalysis -     Testosterone,Free and Total  Screen for colon cancer -     Ambulatory referral to Gastroenterology  Screening for prostate cancer -     PSA Total (Reflex To Free)  Encounter for vitamin deficiency screening -     VITAMIN D 25 Hydroxy (Vit-D Deficiency, Fractures)  Other orders -     Tdap vaccine greater than or equal to 7yo IM -     traZODone (DESYREL) 150 MG tablet; Use from 1/3 to 1 tablet nightly as needed for sleep.       I have discontinued Jesse Thomas's sildenafil. I am also having him start on traZODone. Additionally, I am having him maintain his fish oil-omega-3 fatty acids.  Allergies as of 06/26/2017   No Known Allergies     Medication List        Accurate as of 06/26/17  6:52 PM. Always use your most recent med list.          fish oil-omega-3 fatty acids 1000 MG capsule Take 2 g by mouth daily.   traZODone 150 MG tablet Commonly known as:  DESYREL Use from 1/3 to 1 tablet nightly as needed for sleep.        Follow-up: No follow-ups on file.  Claretta Fraise, M.D.

## 2017-06-26 NOTE — Addendum Note (Signed)
Addended by: Claretta Fraise on: 06/26/2017 06:52 PM   Modules accepted: Orders

## 2017-06-27 ENCOUNTER — Other Ambulatory Visit: Payer: BLUE CROSS/BLUE SHIELD

## 2017-06-27 DIAGNOSIS — Z0000 Encounter for general adult medical examination without abnormal findings: Secondary | ICD-10-CM | POA: Diagnosis not present

## 2017-06-27 DIAGNOSIS — Z125 Encounter for screening for malignant neoplasm of prostate: Secondary | ICD-10-CM | POA: Diagnosis not present

## 2017-06-27 DIAGNOSIS — E291 Testicular hypofunction: Secondary | ICD-10-CM | POA: Diagnosis not present

## 2017-06-27 DIAGNOSIS — E782 Mixed hyperlipidemia: Secondary | ICD-10-CM | POA: Diagnosis not present

## 2017-06-27 LAB — URINALYSIS
BILIRUBIN UA: NEGATIVE
GLUCOSE, UA: NEGATIVE
Ketones, UA: NEGATIVE
Leukocytes, UA: NEGATIVE
Nitrite, UA: NEGATIVE
PROTEIN UA: NEGATIVE
SPEC GRAV UA: 1.02 (ref 1.005–1.030)
Urobilinogen, Ur: 0.2 mg/dL (ref 0.2–1.0)
pH, UA: 6 (ref 5.0–7.5)

## 2017-06-28 ENCOUNTER — Other Ambulatory Visit: Payer: Self-pay | Admitting: *Deleted

## 2017-06-28 ENCOUNTER — Other Ambulatory Visit: Payer: Self-pay | Admitting: Family Medicine

## 2017-06-28 DIAGNOSIS — E291 Testicular hypofunction: Secondary | ICD-10-CM

## 2017-06-28 LAB — CMP14+EGFR
A/G RATIO: 1.6 (ref 1.2–2.2)
ALBUMIN: 4.1 g/dL (ref 3.5–5.5)
ALT: 20 IU/L (ref 0–44)
AST: 14 IU/L (ref 0–40)
Alkaline Phosphatase: 75 IU/L (ref 39–117)
BILIRUBIN TOTAL: 0.4 mg/dL (ref 0.0–1.2)
BUN/Creatinine Ratio: 9 (ref 9–20)
BUN: 12 mg/dL (ref 6–24)
CHLORIDE: 104 mmol/L (ref 96–106)
CO2: 25 mmol/L (ref 20–29)
Calcium: 9.1 mg/dL (ref 8.7–10.2)
Creatinine, Ser: 1.28 mg/dL — ABNORMAL HIGH (ref 0.76–1.27)
GFR calc Af Amer: 75 mL/min/{1.73_m2} (ref >59)
GFR calc non Af Amer: 65 mL/min/{1.73_m2} (ref >59)
GLOBULIN, TOTAL: 2.5 g/dL (ref 1.5–4.5)
Glucose: 79 mg/dL (ref 65–99)
Potassium: 4.1 mmol/L (ref 3.5–5.2)
SODIUM: 143 mmol/L (ref 134–144)
Total Protein: 6.6 g/dL (ref 6.0–8.5)

## 2017-06-28 LAB — CBC WITH DIFFERENTIAL/PLATELET
Basophils Absolute: 0 10*3/uL (ref 0.0–0.2)
Basos: 1 %
EOS (ABSOLUTE): 0.2 10*3/uL (ref 0.0–0.4)
EOS: 6 %
HEMATOCRIT: 41.5 % (ref 37.5–51.0)
HEMOGLOBIN: 14.3 g/dL (ref 13.0–17.7)
Immature Grans (Abs): 0 10*3/uL (ref 0.0–0.1)
Immature Granulocytes: 0 %
LYMPHS ABS: 1.4 10*3/uL (ref 0.7–3.1)
Lymphs: 34 %
MCH: 28.7 pg (ref 26.6–33.0)
MCHC: 34.5 g/dL (ref 31.5–35.7)
MCV: 83 fL (ref 79–97)
MONOCYTES: 12 %
Monocytes Absolute: 0.5 10*3/uL (ref 0.1–0.9)
NEUTROS ABS: 1.9 10*3/uL (ref 1.4–7.0)
Neutrophils: 47 %
Platelets: 177 10*3/uL (ref 150–379)
RBC: 4.99 x10E6/uL (ref 4.14–5.80)
RDW: 15.3 % (ref 12.3–15.4)
WBC: 4 10*3/uL (ref 3.4–10.8)

## 2017-06-28 LAB — LIPID PANEL
CHOLESTEROL TOTAL: 201 mg/dL — AB (ref 100–199)
Chol/HDL Ratio: 5.6 ratio — ABNORMAL HIGH (ref 0.0–5.0)
HDL: 36 mg/dL — ABNORMAL LOW (ref >39)
LDL Calculated: 138 mg/dL — ABNORMAL HIGH (ref 0–99)
Triglycerides: 134 mg/dL (ref 0–149)
VLDL Cholesterol Cal: 27 mg/dL (ref 5–40)

## 2017-06-28 LAB — TESTOSTERONE,FREE AND TOTAL
TESTOSTERONE: 302 ng/dL (ref 264–916)
Testosterone, Free: 7.3 pg/mL (ref 6.8–21.5)

## 2017-06-28 LAB — VITAMIN D 25 HYDROXY (VIT D DEFICIENCY, FRACTURES): VIT D 25 HYDROXY: 25.4 ng/mL — AB (ref 30.0–100.0)

## 2017-06-28 LAB — PSA TOTAL (REFLEX TO FREE): Prostate Specific Ag, Serum: 1 ng/mL (ref 0.0–4.0)

## 2017-06-28 LAB — TSH: TSH: 2.18 u[IU]/mL (ref 0.450–4.500)

## 2017-06-28 MED ORDER — VITAMIN D (ERGOCALCIFEROL) 1.25 MG (50000 UNIT) PO CAPS: 50000.0000 [IU] | ORAL_CAPSULE | ORAL | 0 refills | Status: DC

## 2017-07-18 ENCOUNTER — Ambulatory Visit (INDEPENDENT_AMBULATORY_CARE_PROVIDER_SITE_OTHER): Payer: Self-pay

## 2017-07-18 DIAGNOSIS — Z1211 Encounter for screening for malignant neoplasm of colon: Secondary | ICD-10-CM

## 2017-07-18 NOTE — Progress Notes (Signed)
Pt does not want to schedule until he talks to his insurance company to make sure they pay for it in the hospital. He said he would call me back and schedule and I will mail his instructions to him. He is aware that if his insurance will only pay for a tcs in a doctors office he will need to go to [place] to have it done. He said he will call back as soon as he can. I gave him a card with my name on it.

## 2017-07-18 NOTE — Progress Notes (Signed)
Gastroenterology Pre-Procedure Review  Request Date:07/18/17 Requesting Physician: Dr.Stacks (no previous tcs)  PATIENT REVIEW QUESTIONS: The patient responded to the following health history questions as indicated:    1. Diabetes Melitis: no 2. Joint replacements in the past 12 months: no 3. Major health problems in the past 3 months: no 4. Has an artificial valve or MVP: no 5. Has a defibrillator: no 6. Has been advised in past to take antibiotics in advance of a procedure like teeth cleaning: no 7. Family history of colon cancer: no  8. Alcohol Use: no 9. History of sleep apnea: no  10. History of coronary artery or other vascular stents placed within the last 12 months: no 11. History of any prior anesthesia complications: no    MEDICATIONS & ALLERGIES:    Patient reports the following regarding taking any blood thinners:   Plavix? no Aspirin? no Coumadin? no Brilinta? no Xarelto? no Eliquis? no Pradaxa? no Savaysa? no Effient? no  Patient confirms/reports the following medications:  Current Outpatient Medications  Medication Sig Dispense Refill  . fish oil-omega-3 fatty acids 1000 MG capsule Take 2 g by mouth daily.    . traZODone (DESYREL) 150 MG tablet Use from 1/3 to 1 tablet nightly as needed for sleep. 30 tablet 5  . Vitamin D, Ergocalciferol, (DRISDOL) 50000 units CAPS capsule Take 1 capsule (50,000 Units total) by mouth 2 (two) times a week. 16 capsule 0   No current facility-administered medications for this visit.     Patient confirms/reports the following allergies:  No Known Allergies  No orders of the defined types were placed in this encounter.   AUTHORIZATION INFORMATION Primary Insurance: [place],  [place] #: [id number] Pre-Cert / Josem Kaufmann required: no   SCHEDULE INFORMATION: Procedure has been scheduled as follows:  Date:    , Time:  Location:   This Gastroenterology Pre-Precedure Review Form is being routed to the following provider(s): Roseanne Kaufman NP

## 2017-07-25 NOTE — Progress Notes (Signed)
Will wait to approve until he calls back.

## 2017-10-20 ENCOUNTER — Telehealth: Payer: Self-pay | Admitting: Internal Medicine

## 2017-10-20 NOTE — Telephone Encounter (Signed)
[phone]  PATIENT CALLED TO SCHEDULE HIS TCS

## 2017-10-25 NOTE — Telephone Encounter (Signed)
Tried to call pt- NA 

## 2017-11-03 MED ORDER — NA SULFATE-K SULFATE-MG SULF 17.5-3.13-1.6 GM/177ML PO SOLN: 1.0000 | ORAL | 0 refills | Status: DC

## 2017-11-03 NOTE — Telephone Encounter (Signed)
Tried to call pt- NA- LMOM 

## 2017-11-03 NOTE — Progress Notes (Signed)
Pt called back and scheduled tcs for 12/06/17 at 9:00am with RMR. Instructions done and mailed to the pt, orders have been put in.

## 2017-11-03 NOTE — Patient Instructions (Addendum)
Jesse Thomas   July 24, 1965 MRN: [id number]    Procedure Date: 01/17/18/19 Time to register: 9:45am Place to register: [place] Stay Procedure Time: 10:45am Scheduled provider: R. Garfield Cornea, MD  PREPARATION FOR COLONOSCOPY WITH TRI-LYTE SPLIT PREP  Please notify us immediately if you are diabetic, take iron supplements, or if you are on Coumadin or any other blood thinners.    You will need to purchase 1 fleet enema and 1 box of Bisacodyl 62m tablets.   2 DAYS BEFORE PROCEDURE:  DATE: 01/15/18   DAY: Monday Begin clear liquid diet AFTER your lunch meal. NO SOLID FOODS after this point.  1 DAY BEFORE PROCEDURE:  DATE: 01/16/18   DAY: Tuesday Continue clear liquids the entire day - NO SOLID FOOD.    At 2:00 pm:  Take 2 Bisacodyl tablets.   At 4:00pm:  Start drinking your solution. Make sure you mix well per instructions on the bottle. Try to drink 1 (one) 8 ounce glass every 10-15 minutes until you have consumed HALF the jug. You should complete by 6:00pm.You must keep the left over solution refrigerated until completed next day.  Continue clear liquids. You must drink plenty of clear liquids to prevent dehyration and kidney failure.     DAY OF PROCEDURE:   DATE: 01/17/18  DAY: Wednesday If you take medications for your heart, blood pressure or breathing, you may take these medications.   Five hours before your procedure time @ 5:45am:  Finish remaining amout of bowel prep, drinking 1 (one) 8 ounce glass every 10-15 minutes until complete. You have two hours to consume remaining prep.   Three hours before your procedure time _0 :45am:  Nothing by mouth.   At least one hour before going to the hospital:  Give yourself one Fleet enema. You may take your morning medications with sip of water unless we have instructed otherwise.      Please see below for Dietary Information.  CLEAR LIQUIDS INCLUDE:  Water Jello (NOT red in color)   Ice Popsicles (NOT red in color)    Tea (sugar ok, no milk/cream) Powdered fruit flavored drinks  Coffee (sugar ok, no milk/cream) Gatorade/ Lemonade/ Kool-Aid  (NOT red in color)   Juice: apple, white grape, white cranberry Soft drinks  Clear bullion, consomme, broth (fat free beef/chicken/vegetable)  Carbonated beverages (any kind)  Strained chicken noodle soup Hard Candy   Remember: Clear liquids are liquids that will allow you to see your fingers on the other side of a clear glass. Be sure liquids are NOT red in color, and not cloudy, but CLEAR.  DO NOT EAT OR DRINK ANY OF THE FOLLOWING:  Dairy products of any kind   Cranberry juice Tomato juice / V8 juice   Grapefruit juice Orange juice     Red grape juice  Do not eat any solid foods, including such foods as: cereal, oatmeal, yogurt, fruits, vegetables, creamed soups, eggs, bread, crackers, pureed foods in a blender, etc.   HELPFUL HINTS FOR DRINKING PREP SOLUTION:   Make sure prep is extremely cold. Mix and refrigerate the the morning of the prep. You may also put in the freezer.   You may try mixing some Crystal Light or Country Time Lemonade if you prefer. Mix in small amounts; add more if necessary.  Try drinking through a straw  Rinse mouth with water or a mouthwash between glasses, to remove after-taste.  Try sipping on a cold beverage /ice/ popsicles between glasses of prep.  Place  a piece of sugar-free hard candy in mouth between glasses.  If you become nauseated, try consuming smaller amounts, or stretch out the time between glasses. Stop for 30-60 minutes, then slowly start back drinking.        OTHER INSTRUCTIONS  You will need a responsible adult at least 50 years of age to accompany you and drive you home. This person must remain in the waiting room during your procedure. The hospital will cancel your procedure if you do not have a responsible adult with you.   1. Wear loose fitting clothing that is easily removed. 2. Leave jewelry and  other valuables at home.  3. Remove all body piercing jewelry and leave at home. 4. Total time from sign-in until discharge is approximately 2-3 hours. 5. You should go home directly after your procedure and rest. You can resume normal activities the day after your procedure. 6. The day of your procedure you should not:  Drive  Make legal decisions  Operate machinery  Drink alcohol  Return to work   You may call the office (Dept: [phone]) before 5:00pm, or page the doctor on call [phone]) after 5:00pm, for further instructions, if necessary.   Insurance Information YOU WILL NEED TO CHECK WITH YOUR INSURANCE COMPANY FOR THE BENEFITS OF COVERAGE YOU HAVE FOR THIS PROCEDURE.  UNFORTUNATELY, NOT ALL INSURANCE COMPANIES HAVE BENEFITS TO COVER ALL OR PART OF THESE TYPES OF PROCEDURES.  IT IS YOUR RESPONSIBILITY TO CHECK YOUR BENEFITS, HOWEVER, WE WILL BE GLAD TO ASSIST YOU WITH ANY CODES YOUR INSURANCE COMPANY MAY NEED.    PLEASE NOTE THAT MOST INSURANCE COMPANIES WILL NOT COVER A SCREENING COLONOSCOPY FOR PEOPLE UNDER THE AGE OF 50  IF YOU HAVE BCBS INSURANCE, YOU MAY HAVE BENEFITS FOR A SCREENING COLONOSCOPY BUT IF POLYPS ARE FOUND THE DIAGNOSIS WILL CHANGE AND THEN YOU MAY HAVE A DEDUCTIBLE THAT WILL NEED TO BE MET. SO PLEASE MAKE SURE YOU CHECK YOUR BENEFITS FOR A SCREENING COLONOSCOPY AS WELL AS A DIAGNOSTIC COLONOSCOPY.

## 2017-11-03 NOTE — Telephone Encounter (Signed)
Pt has been scheduled for 12/06/17 at 9:00am with RMR. Instructions mailed to the pt. See triage form.

## 2017-11-03 NOTE — Addendum Note (Signed)
Addended by: Claudina Lick on: 11/03/2017 11:46 AM   Modules accepted: Orders, SmartSet

## 2017-11-08 MED ORDER — PEG 3350-KCL-NA BICARB-NACL 420 G PO SOLR: 4000.0000 mL | ORAL | 0 refills | Status: DC

## 2017-11-08 NOTE — Addendum Note (Signed)
Addended by: Claudina Lick on: 11/08/2017 04:22 PM   Modules accepted: Orders

## 2017-11-08 NOTE — Progress Notes (Signed)
Pt called, suprep is $80 and he wants something cheaper. I have sent in trilyte and new instructions have been mailed to the pt.

## 2017-11-09 NOTE — Progress Notes (Signed)
Appropriate.

## 2017-11-22 ENCOUNTER — Ambulatory Visit (INDEPENDENT_AMBULATORY_CARE_PROVIDER_SITE_OTHER): Payer: BLUE CROSS/BLUE SHIELD

## 2017-11-22 ENCOUNTER — Encounter: Payer: Self-pay | Admitting: Physician Assistant

## 2017-11-22 ENCOUNTER — Ambulatory Visit: Payer: BLUE CROSS/BLUE SHIELD | Admitting: Physician Assistant

## 2017-11-22 VITALS — BP 117/77 | HR 69 | Temp 97.6°F | Ht 66.0 in | Wt 148.0 lb

## 2017-11-22 DIAGNOSIS — M545 Low back pain, unspecified: Secondary | ICD-10-CM

## 2017-11-22 DIAGNOSIS — M25511 Pain in right shoulder: Secondary | ICD-10-CM | POA: Diagnosis not present

## 2017-11-22 DIAGNOSIS — S4991XA Unspecified injury of right shoulder and upper arm, initial encounter: Secondary | ICD-10-CM | POA: Diagnosis not present

## 2017-11-22 DIAGNOSIS — M7989 Other specified soft tissue disorders: Secondary | ICD-10-CM | POA: Diagnosis not present

## 2017-11-22 DIAGNOSIS — M25572 Pain in left ankle and joints of left foot: Secondary | ICD-10-CM

## 2017-11-22 DIAGNOSIS — S20221A Contusion of right back wall of thorax, initial encounter: Secondary | ICD-10-CM | POA: Diagnosis not present

## 2017-11-22 DIAGNOSIS — S3992XA Unspecified injury of lower back, initial encounter: Secondary | ICD-10-CM | POA: Diagnosis not present

## 2017-11-22 DIAGNOSIS — S40011A Contusion of right shoulder, initial encounter: Secondary | ICD-10-CM | POA: Diagnosis not present

## 2017-11-22 DIAGNOSIS — S299XXA Unspecified injury of thorax, initial encounter: Secondary | ICD-10-CM | POA: Diagnosis not present

## 2017-11-22 MED ORDER — IBUPROFEN 800 MG PO TABS: 800.0000 mg | ORAL_TABLET | Freq: Three times a day (TID) | ORAL | 0 refills | Status: DC | PRN
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
## 2017-12-04 ENCOUNTER — Telehealth: Payer: Self-pay | Admitting: Internal Medicine

## 2017-12-04 NOTE — Telephone Encounter (Signed)
[phone] please call patient, needs to reschedule his procedure

## 2017-12-04 NOTE — Telephone Encounter (Signed)
Pt called- his mother-in-law passed away and his wife had to go to [place]. He does not have anyone else to come with him and bring him home. I have rescheduled the pt to 01/17/18 and have mailed new instructions to him. Called Hoyle Sauer and moved appt date and time.

## 2017-12-04 NOTE — Progress Notes (Signed)
See phone note, pt called to reschedule procedure. New instructions mailed to the pt.

## 2018-01-17 ENCOUNTER — Other Ambulatory Visit: Payer: Self-pay

## 2018-01-17 ENCOUNTER — Encounter (HOSPITAL_COMMUNITY): Payer: Self-pay | Admitting: *Deleted

## 2018-01-17 ENCOUNTER — Encounter (HOSPITAL_COMMUNITY)
Admission: RE | Disposition: A | Discharge status: Home / Self Care | Payer: Self-pay | Source: Ambulatory Visit | Attending: Internal Medicine

## 2018-01-17 ENCOUNTER — Ambulatory Visit (HOSPITAL_COMMUNITY)
Admission: RE | Admit: 2018-01-17 | Discharge: 2018-01-17 | Disposition: A | Discharge status: Home / Self Care | Payer: BLUE CROSS/BLUE SHIELD | Source: Ambulatory Visit | Attending: Internal Medicine | Admitting: Internal Medicine

## 2018-01-17 DIAGNOSIS — Z1211 Encounter for screening for malignant neoplasm of colon: Secondary | ICD-10-CM | POA: Diagnosis not present

## 2018-01-17 DIAGNOSIS — D123 Benign neoplasm of transverse colon: Secondary | ICD-10-CM | POA: Diagnosis not present

## 2018-01-17 DIAGNOSIS — Z79899 Other long term (current) drug therapy: Secondary | ICD-10-CM | POA: Diagnosis not present

## 2018-01-17 HISTORY — PX: COLONOSCOPY: SHX5424

## 2018-01-17 HISTORY — PX: POLYPECTOMY: SHX5525

## 2018-01-17 HISTORY — DX: Personal history of urinary calculi: Z87.442

## 2018-01-17 SURGERY — COLONOSCOPY
Anesthesia: Moderate Sedation

## 2018-01-17 MED ORDER — MIDAZOLAM HCL 5 MG/5ML IJ SOLN
INTRAMUSCULAR | Status: DC | PRN
  Administered 2018-01-17 (×2): 1 mg via INTRAVENOUS
  Administered 2018-01-17: 2 mg via INTRAVENOUS
  Administered 2018-01-17: 1 mg via INTRAVENOUS

## 2018-01-17 MED ORDER — MEPERIDINE HCL 100 MG/ML IJ SOLN
INTRAMUSCULAR | Status: DC | PRN
  Administered 2018-01-17: 25 mg via INTRAVENOUS

## 2018-01-17 MED ORDER — ONDANSETRON HCL 4 MG/2ML IJ SOLN
INTRAMUSCULAR | Status: AC
  Filled 2018-01-17: qty 2

## 2018-01-17 MED ORDER — STERILE WATER FOR IRRIGATION IR SOLN
Status: DC | PRN
  Administered 2018-01-17: 11:00:00

## 2018-01-17 MED ORDER — ONDANSETRON HCL 4 MG/2ML IJ SOLN
INTRAMUSCULAR | Status: DC | PRN
  Administered 2018-01-17: 4 mg via INTRAVENOUS

## 2018-01-17 MED ORDER — SODIUM CHLORIDE 0.9 % IV SOLN
INTRAVENOUS | Status: DC
  Administered 2018-01-17: 1000 mL via INTRAVENOUS

## 2018-01-17 MED ORDER — MIDAZOLAM HCL 5 MG/5ML IJ SOLN
INTRAMUSCULAR | Status: AC
  Filled 2018-01-17: qty 10

## 2018-01-17 MED ORDER — MEPERIDINE HCL 50 MG/ML IJ SOLN
INTRAMUSCULAR | Status: AC
  Filled 2018-01-17: qty 1

## 2018-01-17 NOTE — Discharge Instructions (Signed)
°Colonoscopy °Discharge Instructions ° °Read the instructions outlined below and refer to this sheet in the next few weeks. These discharge instructions provide you with general information on caring for yourself after you leave the hospital. Your doctor may also give you specific instructions. While your treatment has been planned according to the most current medical practices available, unavoidable complications occasionally occur. If you have any problems or questions after discharge, call Dr. Rourk at [phone]. °ACTIVITY °· You may resume your regular activity, but move at a slower pace for the next 24 hours.  °· Take frequent rest periods for the next 24 hours.  °· Walking will help get rid of the air and reduce the bloated feeling in your belly (abdomen).  °· No driving for 24 hours (because of the medicine (anesthesia) used during the test).   °· Do not sign any important legal documents or operate any machinery for 24 hours (because of the anesthesia used during the test).  °NUTRITION °· Drink plenty of fluids.  °· You may resume your normal diet as instructed by your doctor.  °· Begin with a light meal and progress to your normal diet. Heavy or fried foods are harder to digest and may make you feel sick to your stomach (nauseated).  °· Avoid alcoholic beverages for 24 hours or as instructed.  °MEDICATIONS °· You may resume your normal medications unless your doctor tells you otherwise.  °WHAT YOU CAN EXPECT TODAY °· Some feelings of bloating in the abdomen.  °· Passage of more gas than usual.  °· Spotting of blood in your stool or on the toilet paper.  °IF YOU HAD POLYPS REMOVED DURING THE COLONOSCOPY: °· No aspirin products for 7 days or as instructed.  °· No alcohol for 7 days or as instructed.  °· Eat a soft diet for the next 24 hours.  °FINDING OUT THE RESULTS OF YOUR TEST °Not all test results are available during your visit. If your test results are not back during the visit, make an appointment  with your caregiver to find out the results. Do not assume everything is normal if you have not heard from your caregiver or the medical facility. It is important for you to follow up on all of your test results.  °SEEK IMMEDIATE MEDICAL ATTENTION IF: °· You have more than a spotting of blood in your stool.  °· Your belly is swollen (abdominal distention).  °· You are nauseated or vomiting.  °· You have a temperature over 101.  °· You have abdominal pain or discomfort that is severe or gets worse throughout the day.  ° ° °Colon polyp information provided ° °Further recommendations to follow pending review of pathology report. ° ° °Colon Polyps °Polyps are tissue growths inside the body. Polyps can grow in many places, including the large intestine (colon). A polyp may be a round bump or a mushroom-shaped growth. You could have one polyp or several. °Most colon polyps are noncancerous (benign). However, some colon polyps can become cancerous over time. °What are the causes? °The exact cause of colon polyps is not known. °What increases the risk? °This condition is more likely to develop in people who: °· Have a family history of colon cancer or colon polyps. °· Are older than 50 or older than 45 if they are African American. °· Have inflammatory bowel disease, such as ulcerative colitis or Crohn disease. °· Are overweight. °· Smoke cigarettes. °· Do not get enough exercise. °· Drink too much alcohol. °·   Eat a diet that is: °? High in fat and red meat. °? Low in fiber. °· Had childhood cancer that was treated with abdominal radiation. ° °What are the signs or symptoms? °Most polyps do not cause symptoms. If you have symptoms, they may include: °· Blood coming from your rectum when having a bowel movement. °· Blood in your stool. The stool may look dark red or black. °· A change in bowel habits, such as constipation or diarrhea. ° °How is this diagnosed? °This condition is diagnosed with a colonoscopy. This is a  procedure that uses a lighted, flexible scope to look at the inside of your colon. °How is this treated? °Treatment for this condition involves removing any polyps that are found. Those polyps will then be tested for cancer. If cancer is found, your health care provider will talk to you about options for colon cancer treatment. °Follow these instructions at home: °Diet °· Eat plenty of fiber, such as fruits, vegetables, and whole grains. °· Eat foods that are high in calcium and vitamin D, such as milk, cheese, yogurt, eggs, liver, fish, and broccoli. °· Limit foods high in fat, red meats, and processed meats, such as hot dogs, sausage, bacon, and lunch meats. °· Maintain a healthy weight, or lose weight if recommended by your health care provider. °General instructions °· Do not smoke cigarettes. °· Do not drink alcohol excessively. °· Keep all follow-up visits as told by your health care provider. This is important. This includes keeping regularly scheduled colonoscopies. Talk to your health care provider about when you need a colonoscopy. °· Exercise every day or as told by your health care provider. °Contact a health care provider if: °· You have new or worsening bleeding during a bowel movement. °· You have new or increased blood in your stool. °· You have a change in bowel habits. °· You unexpectedly lose weight. °This information is not intended to replace advice given to you by your health care provider. Make sure you discuss any questions you have with your health care provider. °Document Released: 12/09/2003 Document Revised: 08/20/2015 Document Reviewed: 02/02/2015 °Elsevier Interactive Patient Education © 2018 [employer] ° °

## 2018-01-17 NOTE — Op Note (Signed)
[facility] Patient Name: Jesse Thomas Procedure Date: 01/17/2018 10:31 AM MRN: [id number] Date of Birth: 1965/09/25 Attending MD: Norvel Richards , MD CSN: [id number] Age: 52 Admit Type: Outpatient Procedure:                Colonoscopy Indications:              Screening for colorectal malignant neoplasm Providers:                Norvel Richards, MD, Otis Peak B. Gwenlyn Perking RN, RN,                            Nelma Rothman, Technician Referring MD:              Medicines:                Meperidine 25 mg IV, Midazolam 4 mg IV, Ondansetron                            4 mg IV Complications:            No immediate complications. Estimated Blood Loss:     Estimated blood loss was minimal. Procedure:                Pre-Anesthesia Assessment:                           - Prior to the procedure, a History and Physical                            was performed, and patient medications and                            allergies were reviewed. The patient's tolerance of                            previous anesthesia was also reviewed. The risks                            and benefits of the procedure and the sedation                            options and risks were discussed with the patient.                            All questions were answered, and informed consent                            was obtained. Prior Anticoagulants: The patient has                            taken no previous anticoagulant or antiplatelet                            agents. ASA Grade Assessment: II - A patient with  mild systemic disease. After reviewing the risks                            and benefits, the patient was deemed in                            satisfactory condition to undergo the procedure.                           After obtaining informed consent, the colonoscope                            was passed under direct vision. Throughout the                            procedure,  the patient's blood pressure, pulse, and                            oxygen saturations were monitored continuously. The                            CF-HQ190L ([id number]) scope was introduced through                            the anus and advanced to the the cecum, identified                            by appendiceal orifice and ileocecal valve. The                            colonoscopy was performed without difficulty. The                            patient tolerated the procedure well. The quality                            of the bowel preparation was adequate. Scope In: 10:48:02 AM Scope Out: 11:05:24 AM Scope Withdrawal Time: 0 hours 13 minutes 0 seconds  Total Procedure Duration: 0 hours 17 minutes 22 seconds  Findings:      The perianal and digital rectal examinations were normal.      Four sessile polyps were found in the hepatic flexure. The polyps were 4       to 5 mm in size. These polyps were removed with a cold snare. Resection       and retrieval were complete. Estimated blood loss was minimal.      The exam was otherwise without abnormality on direct and retroflexion       views. Impression:               - Four 4 to 5 mm polyps at the hepatic flexure,                            removed with a cold snare. Resected and retrieved.                           -  The examination was otherwise normal on direct                            and retroflexion views. Moderate Sedation:      Moderate (conscious) sedation was administered by the endoscopy nurse       and supervised by the endoscopist. The following parameters were       monitored: oxygen saturation, heart rate, blood pressure, respiratory       rate, EKG, adequacy of pulmonary ventilation, and response to care.       Total physician intraservice time was 20 minutes. Recommendation:           - Patient has a contact number available for                            emergencies. The signs and symptoms of potential                             delayed complications were discussed with the                            patient. Return to normal activities tomorrow.                            Written discharge instructions were provided to the                            patient.                           - Resume previous diet.                           - Continue present medications. follow-up on                            pathology                           - Return to GI office (date not yet determined). Procedure Code(s):        --- Professional ---                           [phone], Colonoscopy, flexible; with removal of                            tumor(s), polyp(s), or other lesion(s) by snare                            technique                           G0500, Moderate sedation services provided by the                            same physician or other qualified health care  professional performing a gastrointestinal                            endoscopic service that sedation supports,                            requiring the presence of an independent trained                            observer to assist in the monitoring of the                            patient's level of consciousness and physiological                            status; initial 15 minutes of intra-service time;                            patient age 10 years or older (additional time may                            be reported with [phone], as appropriate) Diagnosis Code(s):        --- Professional ---                           Z12.11, Encounter for screening for malignant                            neoplasm of colon                           D12.3, Benign neoplasm of transverse colon (hepatic                            flexure or splenic flexure) CPT copyright 2018 American Medical Association. All rights reserved. The codes documented in this report are preliminary and upon coder review may  be revised to meet current  compliance requirements. [name]. [name], MD Norvel Richards, MD 01/17/2018 11:16:20 AM This report has been signed electronically. Number of Addenda: 0

## 2018-01-17 NOTE — H&P (Signed)
_0 @   Primary Care Physician:  Timmothy Euler, MD Primary Gastroenterologist:  Dr. Gala Romney  Pre-Procedure History & Physical: HPI:  Jesse Thomas is a 52 y.o. male is here for a screening colonoscopy.  No bowel symptoms.  No prior colonoscopy.  No family history of colon cancer.  Past Medical History:  Diagnosis Date  . History of kidney stones   . Hyperlipidemia   . Hypogonadism male     Past Surgical History:  Procedure Laterality Date  . none      Prior to Admission medications   Medication Sig Start Date End Date Taking? Authorizing Provider  fish oil-omega-3 fatty acids 1000 MG capsule Take 2-4 g by mouth daily.    Yes [provider]  ibuprofen (ADVIL,MOTRIN) 800 MG tablet Take 1 tablet (800 mg total) by mouth every 8 (eight) hours as needed. Patient taking differently: Take 800 mg by mouth every 8 (eight) hours as needed for moderate pain.  11/22/17  Yes Terald Sleeper, PA-C  polyethylene glycol-electrolytes (TRILYTE) 420 g solution Take 4,000 mLs by mouth as directed. 11/08/17  Yes Annitta Needs, NP  Vitamin D, Ergocalciferol, (DRISDOL) 50000 units CAPS capsule Take 1 capsule (50,000 Units total) by mouth 2 (two) times a week. 06/29/17  Yes Stacks, Cletus Gash, MD  Na Sulfate-K Sulfate-Mg Sulf (SUPREP BOWEL PREP KIT) 17.5-3.13-1.6 GM/177ML SOLN Take 1 kit by mouth as directed. Patient not taking: Reported on 12/01/2017 11/03/17   Annitta Needs, NP  traZODone (DESYREL) 150 MG tablet Use from 1/3 to 1 tablet nightly as needed for sleep. Patient not taking: Reported on 12/01/2017 06/26/17   Claretta Fraise, MD    Allergies as of 11/03/2017  . (No Known Allergies)    Family History  Problem Relation Age of Onset  . Dementia Mother   . Early death Father     Social History   Socioeconomic History  . Marital status: Married    Spouse name: Not on file  . Number of children: Not on file  . Years of education: Not on file  . Highest education level: Not on file   Occupational History  . Not on file  Social Needs  . Financial resource strain: Not on file  . Food insecurity:    Worry: Not on file    Inability: Not on file  . Transportation needs:    Medical: Not on file    Non-medical: Not on file  Tobacco Use  . Smoking status: Never Smoker  . Smokeless tobacco: Never Used  Substance and Sexual Activity  . Alcohol use: Never    Frequency: Never  . Drug use: Never  . Sexual activity: Yes    Birth control/protection: None  Lifestyle  . Physical activity:    Days per week: Not on file    Minutes per session: Not on file  . Stress: Not on file  Relationships  . Social connections:    Talks on phone: Not on file    Gets together: Not on file    Attends religious service: Not on file    Active member of club or organization: Not on file    Attends meetings of clubs or organizations: Not on file    Relationship status: Not on file  . Intimate partner violence:    Fear of current or ex partner: Not on file    Emotionally abused: Not on file    Physically abused: Not on file    Forced sexual activity: Not on  file  Other Topics Concern  . Not on file  Social History Narrative  . Not on file    Review of Systems: See HPI, otherwise negative ROS  Physical Exam: BP 128/87   Pulse 74   Temp 97.8 F (36.6 C) (Oral)   Resp 20   Ht 5' 6" (1.676 m)   Wt 66.2 kg   SpO2 98%   BMI 23.57 kg/m  General:   Alert,  Well-developed, well-nourished, pleasant and cooperative in NAD Lungs:  Clear throughout to auscultation.   No wheezes, crackles, or rhonchi. No acute distress. Heart:  Regular rate and rhythm; no murmurs, clicks, rubs,  or gallops. Abdomen:  Soft, nontender and nondistended. No masses, hepatosplenomegaly or hernias noted. Normal bowel sounds, without guarding, and without rebound.    Impression/Plan: [name] is now here to undergo a screening colonoscopy.  First ever average her screening examination.  Risks,  benefits, limitations, imponderables and alternatives regarding colonoscopy have been reviewed with the patient. Questions have been answered. All parties agreeable.     Notice:  This dictation was prepared with Dragon dictation along with smaller phrase technology. Any transcriptional errors that result from this process are unintentional and may not be corrected upon review.  

## 2018-01-20 ENCOUNTER — Encounter: Payer: Self-pay | Admitting: Family Medicine

## 2018-01-23 ENCOUNTER — Encounter (HOSPITAL_COMMUNITY): Payer: Self-pay | Admitting: Internal Medicine

## 2018-01-24 ENCOUNTER — Encounter: Payer: Self-pay | Admitting: Internal Medicine

## 2018-02-20 ENCOUNTER — Telehealth: Payer: Self-pay | Admitting: *Deleted

## 2018-02-20 NOTE — Telephone Encounter (Signed)
Pt declines flu shot and says he never gets one.

## 2018-03-26 DIAGNOSIS — H40033 Anatomical narrow angle, bilateral: Secondary | ICD-10-CM | POA: Diagnosis not present

## 2018-03-26 DIAGNOSIS — G44219 Episodic tension-type headache, not intractable: Secondary | ICD-10-CM | POA: Diagnosis not present

## 2018-04-05 DIAGNOSIS — H04123 Dry eye syndrome of bilateral lacrimal glands: Secondary | ICD-10-CM | POA: Diagnosis not present

## 2018-04-20 DIAGNOSIS — G44219 Episodic tension-type headache, not intractable: Secondary | ICD-10-CM | POA: Diagnosis not present

## 2018-07-27 ENCOUNTER — Other Ambulatory Visit: Payer: Self-pay

## 2018-07-27 ENCOUNTER — Ambulatory Visit: Payer: BLUE CROSS/BLUE SHIELD | Admitting: Family

## 2018-09-03 ENCOUNTER — Other Ambulatory Visit: Payer: Self-pay

## 2018-09-04 ENCOUNTER — Encounter: Payer: Self-pay | Admitting: Family Medicine

## 2018-09-04 ENCOUNTER — Ambulatory Visit: Payer: BC Managed Care – PPO | Admitting: Family Medicine

## 2018-09-04 VITALS — BP 136/84 | HR 71 | Temp 97.9°F | Ht 66.0 in | Wt 156.2 lb

## 2018-09-04 DIAGNOSIS — E291 Testicular hypofunction: Secondary | ICD-10-CM | POA: Diagnosis not present

## 2018-09-04 DIAGNOSIS — M2550 Pain in unspecified joint: Secondary | ICD-10-CM

## 2018-09-04 DIAGNOSIS — R7989 Other specified abnormal findings of blood chemistry: Secondary | ICD-10-CM | POA: Diagnosis not present

## 2018-09-04 DIAGNOSIS — R5383 Other fatigue: Secondary | ICD-10-CM

## 2018-09-04 DIAGNOSIS — E782 Mixed hyperlipidemia: Secondary | ICD-10-CM | POA: Diagnosis not present

## 2018-09-04 LAB — URINALYSIS, COMPLETE
Bilirubin, UA: NEGATIVE
Glucose, UA: NEGATIVE
Ketones, UA: NEGATIVE
Leukocytes,UA: NEGATIVE
Nitrite, UA: NEGATIVE
Protein,UA: NEGATIVE
Specific Gravity, UA: 1.02 (ref 1.005–1.030)
Urobilinogen, Ur: 1 mg/dL (ref 0.2–1.0)
pH, UA: 7 (ref 5.0–7.5)

## 2018-09-04 LAB — MICROSCOPIC EXAMINATION
Bacteria, UA: NONE SEEN
Epithelial Cells (non renal): NONE SEEN /hpf (ref 0–10)
Renal Epithel, UA: NONE SEEN /hpf
WBC, UA: NONE SEEN /hpf (ref 0–5)

## 2018-09-04 NOTE — Progress Notes (Signed)
Subjective:  Patient ID: Jesse Thomas, male    DOB: 05-Oct-1965  Age: 53 y.o. MRN: [id number]  CC: Medical Management of Chronic Issues (check up )   HPI Jesse Thomas presents for feeling achy all over and tired all the time.  Symptoms have been increasing over period of several months.  The achiness is moderately painful at 4-6/10.  He is using ibuprofen over-the-counter as needed for relief without good results.  He has a history of low testosterone and low vitamin D.  Additionally he has had a high cholesterol in the past.  Currently he takes over-the-counter fish oil for that. Depression screen [facility] 2/9 09/04/2018 11/22/2017 06/26/2017  Decreased Interest 0 0 0  Down, Depressed, Hopeless 0 0 0  PHQ - 2 Score 0 0 0    History Jesse Thomas has a past medical history of History of kidney stones, Hyperlipidemia, and Hypogonadism male.   He has a past surgical history that includes none; Colonoscopy (N/A, 01/17/2018); and polypectomy (01/17/2018).   His family history includes Dementia in his mother; Early death in his father.He reports that he has never smoked. He has never used smokeless tobacco. He reports that he does not drink alcohol or use drugs.    ROS Review of Systems  Constitutional: Positive for fatigue.  HENT: Negative.   Eyes: Negative for visual disturbance.  Respiratory: Negative for cough and shortness of breath.   Cardiovascular: Negative for chest pain and leg swelling.  Gastrointestinal: Negative for abdominal pain, diarrhea, nausea and vomiting.  Genitourinary: Negative for difficulty urinating.  Musculoskeletal: Positive for myalgias. Negative for arthralgias.  Skin: Negative for rash.  Neurological: Negative for headaches.  Psychiatric/Behavioral: Negative for sleep disturbance.    Objective:  BP 136/84   Pulse 71   Temp 97.9 F (36.6 C) (Oral)   Ht '5\' 6"'  (1.676 m)   Wt 156 lb 3.2 oz (70.9 kg)   BMI 25.21 kg/m   BP Readings from Last 3 Encounters:   09/04/18 136/84  01/17/18 100/73  11/22/17 117/77    Wt Readings from Last 3 Encounters:  09/04/18 156 lb 3.2 oz (70.9 kg)  01/17/18 146 lb (66.2 kg)  11/22/17 148 lb (67.1 kg)     Physical Exam Constitutional:      General: He is not in acute distress.    Appearance: He is well-developed.  HENT:     Head: Normocephalic and atraumatic.     Right Ear: External ear normal.     Left Ear: External ear normal.     Nose: Nose normal.  Eyes:     Conjunctiva/sclera: Conjunctivae normal.     Pupils: Pupils are equal, round, and reactive to light.  Neck:     Musculoskeletal: Normal range of motion and neck supple.  Cardiovascular:     Rate and Rhythm: Normal rate and regular rhythm.     Heart sounds: Normal heart sounds. No murmur.  Pulmonary:     Effort: Pulmonary effort is normal. No respiratory distress.     Breath sounds: Normal breath sounds. No wheezing or rales.  Abdominal:     Palpations: Abdomen is soft.     Tenderness: There is no abdominal tenderness.  Musculoskeletal: Normal range of motion.  Skin:    General: Skin is warm and dry.  Neurological:     Mental Status: He is alert and oriented to person, place, and time.     Deep Tendon Reflexes: Reflexes are normal and symmetric.  Psychiatric:  Behavior: Behavior normal.        Thought Content: Thought content normal.        Judgment: Judgment normal.       Assessment & Plan:   Jesse Thomas was seen today for medical management of chronic issues.  Diagnoses and all orders for this visit:  Mixed hyperlipidemia -     CBC with Differential/Platelet -     CMP14+EGFR -     Lipid panel -     TSH  Hypogonadism in male -     PSA, total and free  Fatigue, unspecified type -     Sedimentation Rate -     Vitamin B12 -     Rheumatoid factor -     Urinalysis, Complete  Arthralgia, unspecified joint -     Sedimentation Rate -     Vitamin B12 -     Rheumatoid factor -     Urinalysis, Complete  Low vitamin  D level -     VITAMIN D 25 Hydroxy (Vit-D Deficiency, Fractures)  Other orders -     Microscopic Examination       I have discontinued Jesse Thomas's polyethylene glycol-electrolytes. I am also having him maintain his fish oil-omega-3 fatty acids, Vitamin D (Ergocalciferol), and ibuprofen.  Allergies as of 09/04/2018   No Known Allergies     Medication List       Accurate as of September 04, 2018  5:32 PM. If you have any questions, ask your nurse or doctor.        STOP taking these medications   polyethylene glycol-electrolytes 420 g solution Commonly known as:  TriLyte Stopped by:  Claretta Fraise, MD     TAKE these medications   fish oil-omega-3 fatty acids 1000 MG capsule Take 2-4 g by mouth daily.   ibuprofen 800 MG tablet Commonly known as:  ADVIL Take 1 tablet (800 mg total) by mouth every 8 (eight) hours as needed. What changed:  reasons to take this   Vitamin D (Ergocalciferol) 1.25 MG (50000 UT) Caps capsule Commonly known as:  DRISDOL Take 1 capsule (50,000 Units total) by mouth 2 (two) times a week.        Follow-up: Return in about 6 weeks (around 10/16/2018).  Claretta Fraise, M.D.

## 2018-09-04 NOTE — Progress Notes (Signed)
Hello Jesse Thomas,  Your lab result is normal.Some minor variations that are not significant are commonly marked abnormal, but do not represent any medical problem for you.  Best regards, Claretta Fraise, M.D.

## 2018-09-05 ENCOUNTER — Telehealth: Payer: Self-pay | Admitting: Family Medicine

## 2018-09-05 ENCOUNTER — Encounter: Payer: Self-pay | Admitting: Family Medicine

## 2018-09-05 ENCOUNTER — Other Ambulatory Visit: Payer: Self-pay

## 2018-09-05 LAB — CBC WITH DIFFERENTIAL/PLATELET
Basophils Absolute: 0 10*3/uL (ref 0.0–0.2)
Basos: 1 %
EOS (ABSOLUTE): 0.2 10*3/uL (ref 0.0–0.4)
Eos: 4 %
Hematocrit: 43.6 % (ref 37.5–51.0)
Hemoglobin: 14.5 g/dL (ref 13.0–17.7)
Immature Grans (Abs): 0 10*3/uL (ref 0.0–0.1)
Immature Granulocytes: 1 %
Lymphocytes Absolute: 1.1 10*3/uL (ref 0.7–3.1)
Lymphs: 27 %
MCH: 27.6 pg (ref 26.6–33.0)
MCHC: 33.3 g/dL (ref 31.5–35.7)
MCV: 83 fL (ref 79–97)
Monocytes Absolute: 0.5 10*3/uL (ref 0.1–0.9)
Monocytes: 12 %
Neutrophils Absolute: 2.3 10*3/uL (ref 1.4–7.0)
Neutrophils: 55 %
Platelets: 199 10*3/uL (ref 150–450)
RBC: 5.25 x10E6/uL (ref 4.14–5.80)
RDW: 15.6 % — ABNORMAL HIGH (ref 11.6–15.4)
WBC: 4.1 10*3/uL (ref 3.4–10.8)

## 2018-09-05 LAB — CMP14+EGFR
ALT: 32 IU/L (ref 0–44)
AST: 25 IU/L (ref 0–40)
Albumin/Globulin Ratio: 1.8 (ref 1.2–2.2)
Albumin: 4.2 g/dL (ref 3.8–4.9)
Alkaline Phosphatase: 75 IU/L (ref 39–117)
BUN/Creatinine Ratio: 12 (ref 9–20)
BUN: 16 mg/dL (ref 6–24)
Bilirubin Total: 0.6 mg/dL (ref 0.0–1.2)
CO2: 24 mmol/L (ref 20–29)
Calcium: 9.1 mg/dL (ref 8.7–10.2)
Chloride: 103 mmol/L (ref 96–106)
Creatinine, Ser: 1.31 mg/dL — ABNORMAL HIGH (ref 0.76–1.27)
GFR calc Af Amer: 72 mL/min/{1.73_m2} (ref >59)
GFR calc non Af Amer: 63 mL/min/{1.73_m2} (ref >59)
Globulin, Total: 2.4 g/dL (ref 1.5–4.5)
Glucose: 75 mg/dL (ref 65–99)
Potassium: 4 mmol/L (ref 3.5–5.2)
Sodium: 139 mmol/L (ref 134–144)
Total Protein: 6.6 g/dL (ref 6.0–8.5)

## 2018-09-05 LAB — SEDIMENTATION RATE: Sed Rate: 24 mm/hr (ref 0–30)

## 2018-09-05 LAB — LIPID PANEL
Chol/HDL Ratio: 5.7 ratio — ABNORMAL HIGH (ref 0.0–5.0)
Cholesterol, Total: 221 mg/dL — ABNORMAL HIGH (ref 100–199)
HDL: 39 mg/dL — ABNORMAL LOW (ref >39)
LDL Calculated: 148 mg/dL — ABNORMAL HIGH (ref 0–99)
Triglycerides: 168 mg/dL — ABNORMAL HIGH (ref 0–149)
VLDL Cholesterol Cal: 34 mg/dL (ref 5–40)

## 2018-09-05 LAB — RHEUMATOID FACTOR: Rhuematoid fact SerPl-aCnc: <10 IU/mL (ref 0.0–13.9)

## 2018-09-05 LAB — PSA, TOTAL AND FREE
PSA, Free Pct: 53 %
PSA, Free: 0.53 ng/mL
Prostate Specific Ag, Serum: 1 ng/mL (ref 0.0–4.0)

## 2018-09-05 LAB — TSH: TSH: 1.11 u[IU]/mL (ref 0.450–4.500)

## 2018-09-05 LAB — VITAMIN D 25 HYDROXY (VIT D DEFICIENCY, FRACTURES): Vit D, 25-Hydroxy: 49.8 ng/mL (ref 30.0–100.0)

## 2018-09-05 LAB — VITAMIN B12: Vitamin B-12: 615 pg/mL (ref 232–1245)

## 2018-09-05 MED ORDER — ATORVASTATIN CALCIUM 40 MG PO TABS: 40.0000 mg | ORAL_TABLET | Freq: Every day | ORAL | 1 refills | Status: DC

## 2018-09-05 MED ORDER — ATORVASTATIN CALCIUM 40 MG PO TABS: 40.0000 mg | ORAL_TABLET | Freq: Every day | ORAL | 5 refills | Status: DC

## 2018-09-05 NOTE — Telephone Encounter (Signed)
Aware of results and new medicine.

## 2018-09-05 NOTE — Addendum Note (Signed)
Addended by: Shelbie Ammons on: 09/05/2018 02:32 PM   Modules accepted: Orders

## 2018-11-02 ENCOUNTER — Other Ambulatory Visit: Payer: Self-pay

## 2018-11-05 ENCOUNTER — Encounter: Payer: Self-pay | Admitting: Family Medicine

## 2018-11-05 ENCOUNTER — Other Ambulatory Visit: Payer: Self-pay

## 2018-11-05 ENCOUNTER — Ambulatory Visit (INDEPENDENT_AMBULATORY_CARE_PROVIDER_SITE_OTHER): Payer: BC Managed Care – PPO | Admitting: Family Medicine

## 2018-11-05 VITALS — BP 118/81 | HR 70 | Temp 98.6°F | Ht 66.0 in | Wt 151.0 lb

## 2018-11-05 DIAGNOSIS — E782 Mixed hyperlipidemia: Secondary | ICD-10-CM | POA: Diagnosis not present

## 2018-11-05 DIAGNOSIS — M2550 Pain in unspecified joint: Secondary | ICD-10-CM | POA: Diagnosis not present

## 2018-11-05 DIAGNOSIS — E291 Testicular hypofunction: Secondary | ICD-10-CM | POA: Diagnosis not present

## 2018-11-05 DIAGNOSIS — R5383 Other fatigue: Secondary | ICD-10-CM | POA: Diagnosis not present

## 2018-11-05 NOTE — Progress Notes (Signed)
Subjective:  Patient ID: Jesse Thomas, male    DOB: October 27, 1965  Age: 53 y.o. MRN: [id number]  CC: No chief complaint on file.   HPI Terry Eckmann presents for follow-up of his myalgia and arthralgia..  Is still present in the mornings.  Primarily before he gets out of bed.  He will be worse for a few minutes and then it will slowly resolve and not be present for the rest of the day.  It involves the back and the legs and the shoulders as well.  It is moderately severe.  Of note is that his family member in [place] recently died of bone cancer.  He is concerned for multiple myeloma.  Patient in for follow-up of elevated cholesterol. Doing well without complaints on current medication. Denies side effects of statin including myalgia and arthralgia and nausea. Also in today for liver function testing. Currently no chest pain, shortness of breath or other cardiovascular related symptoms noted.  Depression screen [facility] 2/9 11/05/2018 09/04/2018 11/22/2017  Decreased Interest 0 0 0  Down, Depressed, Hopeless 0 0 0  PHQ - 2 Score 0 0 0  Altered sleeping 0 - -  Tired, decreased energy 0 - -  Change in appetite 0 - -  Feeling bad or failure about yourself  0 - -  Trouble concentrating 0 - -  Moving slowly or fidgety/restless 0 - -  Suicidal thoughts 0 - -  PHQ-9 Score 0 - -    History Vick has a past medical history of History of kidney stones, Hyperlipidemia, and Hypogonadism male.   He has a past surgical history that includes none; Colonoscopy (N/A, 01/17/2018); and polypectomy (01/17/2018).   His family history includes Dementia in his mother; Early death in his father.He reports that he has never smoked. He has never used smokeless tobacco. He reports that he does not drink alcohol or use drugs.    ROS Review of Systems  Constitutional: Negative for fever.  Respiratory: Negative for shortness of breath.   Cardiovascular: Negative for chest pain.  Musculoskeletal: Negative for  arthralgias.  Skin: Negative for rash.    Objective:  BP 118/81   Pulse 70   Temp 98.6 F (37 C) (Temporal)   Ht _0  (1.676 m)   Wt 151 lb (68.5 kg)   BMI 24.37 kg/m   BP Readings from Last 3 Encounters:  11/05/18 118/81  09/04/18 136/84  01/17/18 100/73    Wt Readings from Last 3 Encounters:  11/05/18 151 lb (68.5 kg)  09/04/18 156 lb 3.2 oz (70.9 kg)  01/17/18 146 lb (66.2 kg)     Physical Exam Vitals signs reviewed.  Constitutional:      Appearance: He is well-developed.  HENT:     Head: Normocephalic and atraumatic.     Right Ear: Tympanic membrane and external ear normal. No decreased hearing noted.     Left Ear: Tympanic membrane and external ear normal. No decreased hearing noted.     Mouth/Throat:     Pharynx: No oropharyngeal exudate or posterior oropharyngeal erythema.  Eyes:     Pupils: Pupils are equal, round, and reactive to light.  Neck:     Musculoskeletal: Normal range of motion and neck supple.  Cardiovascular:     Rate and Rhythm: Normal rate and regular rhythm.     Heart sounds: No murmur.  Pulmonary:     Effort: No respiratory distress.     Breath sounds: Normal breath sounds.  Abdominal:  General: Bowel sounds are normal.     Palpations: Abdomen is soft. There is no mass.     Tenderness: There is no abdominal tenderness.       Assessment & Plan:   Diagnoses and all orders for this visit:  Mixed hyperlipidemia -     CBC with Differential/Platelet -     CMP14+EGFR -     Lipid panel -     CYCLIC CITRUL PEPTIDE ANTIBODY, IGG/IGA -     Sedimentation rate -     HLA-B27 antigen -     Immunofixation electrophoresis  Arthralgia, unspecified joint -     CBC with Differential/Platelet -     CMP14+EGFR -     Lipid panel -     CYCLIC CITRUL PEPTIDE ANTIBODY, IGG/IGA -     Sedimentation rate -     HLA-B27 antigen -     Immunofixation electrophoresis  Fatigue, unspecified type -     CBC with Differential/Platelet -      CMP14+EGFR -     Lipid panel -     CYCLIC CITRUL PEPTIDE ANTIBODY, IGG/IGA -     Sedimentation rate -     HLA-B27 antigen -     Immunofixation electrophoresis  Hypogonadism in male -     Testosterone,Free and Total       I am having Siler Yon maintain his fish oil-omega-3 fatty acids, Vitamin D (Ergocalciferol), ibuprofen, and atorvastatin.  Allergies as of 11/05/2018   No Known Allergies     Medication List       Accurate as of November 05, 2018  3:23 PM. If you have any questions, ask your nurse or doctor.        atorvastatin 40 MG tablet Commonly known as: LIPITOR Take 1 tablet (40 mg total) by mouth daily. What changed: Another medication with the same name was removed. Continue taking this medication, and follow the directions you see here. Changed by: Claretta Fraise, MD   fish oil-omega-3 fatty acids 1000 MG capsule Take 2-4 g by mouth daily.   ibuprofen 800 MG tablet Commonly known as: ADVIL Take 1 tablet (800 mg total) by mouth every 8 (eight) hours as needed. What changed: reasons to take this   Vitamin D (Ergocalciferol) 1.25 MG (50000 UT) Caps capsule Commonly known as: DRISDOL Take 1 capsule (50,000 Units total) by mouth 2 (two) times a week.        Follow-up: Return in about 6 months (around 05/08/2019).  Claretta Fraise, M.D.

## 2018-11-12 LAB — CBC WITH DIFFERENTIAL/PLATELET
Basophils Absolute: 0.1 10*3/uL (ref 0.0–0.2)
Basos: 1 %
EOS (ABSOLUTE): 0.1 10*3/uL (ref 0.0–0.4)
Eos: 4 %
Hematocrit: 42.5 % (ref 37.5–51.0)
Hemoglobin: 14.2 g/dL (ref 13.0–17.7)
Immature Grans (Abs): 0 10*3/uL (ref 0.0–0.1)
Immature Granulocytes: 0 %
Lymphocytes Absolute: 1.3 10*3/uL (ref 0.7–3.1)
Lymphs: 33 %
MCH: 28.3 pg (ref 26.6–33.0)
MCHC: 33.4 g/dL (ref 31.5–35.7)
MCV: 85 fL (ref 79–97)
Monocytes Absolute: 0.4 10*3/uL (ref 0.1–0.9)
Monocytes: 10 %
Neutrophils Absolute: 2 10*3/uL (ref 1.4–7.0)
Neutrophils: 52 %
Platelets: 206 10*3/uL (ref 150–450)
RBC: 5.02 x10E6/uL (ref 4.14–5.80)
RDW: 15.1 % (ref 11.6–15.4)
WBC: 3.8 10*3/uL (ref 3.4–10.8)

## 2018-11-12 LAB — LIPID PANEL
Chol/HDL Ratio: 2.8 ratio (ref 0.0–5.0)
Cholesterol, Total: 123 mg/dL (ref 100–199)
HDL: 44 mg/dL (ref >39)
LDL Calculated: 65 mg/dL (ref 0–99)
Triglycerides: 69 mg/dL (ref 0–149)
VLDL Cholesterol Cal: 14 mg/dL (ref 5–40)

## 2018-11-12 LAB — CMP14+EGFR
ALT: 30 IU/L (ref 0–44)
AST: 23 IU/L (ref 0–40)
Albumin/Globulin Ratio: 1.6 (ref 1.2–2.2)
Albumin: 4.5 g/dL (ref 3.8–4.9)
Alkaline Phosphatase: 109 IU/L (ref 39–117)
BUN/Creatinine Ratio: 10 (ref 9–20)
BUN: 11 mg/dL (ref 6–24)
Bilirubin Total: 0.5 mg/dL (ref 0.0–1.2)
CO2: 25 mmol/L (ref 20–29)
Calcium: 9.3 mg/dL (ref 8.7–10.2)
Chloride: 102 mmol/L (ref 96–106)
Creatinine, Ser: 1.07 mg/dL (ref 0.76–1.27)
GFR calc Af Amer: 92 mL/min/{1.73_m2} (ref >59)
GFR calc non Af Amer: 80 mL/min/{1.73_m2} (ref >59)
Globulin, Total: 2.8 g/dL (ref 1.5–4.5)
Glucose: 88 mg/dL (ref 65–99)
Potassium: 4.2 mmol/L (ref 3.5–5.2)
Sodium: 141 mmol/L (ref 134–144)
Total Protein: 7.3 g/dL (ref 6.0–8.5)

## 2018-11-12 LAB — IMMUNOFIXATION ELECTROPHORESIS
IgA/Immunoglobulin A, Serum: 183 mg/dL (ref 90–386)
IgG (Immunoglobin G), Serum: 1224 mg/dL (ref 603–1613)
IgM (Immunoglobulin M), Srm: 61 mg/dL (ref 20–172)

## 2018-11-12 LAB — TESTOSTERONE,FREE AND TOTAL
Testosterone, Free: 3.3 pg/mL — ABNORMAL LOW (ref 7.2–24.0)
Testosterone: 212 ng/dL — ABNORMAL LOW (ref 264–916)

## 2018-11-12 LAB — SEDIMENTATION RATE: Sed Rate: 22 mm/hr (ref 0–30)

## 2018-11-12 LAB — HLA-B27 ANTIGEN: HLA B27: NEGATIVE

## 2018-11-12 LAB — CYCLIC CITRUL PEPTIDE ANTIBODY, IGG/IGA: Cyclic Citrullin Peptide Ab: 5 units (ref 0–19)

## 2019-09-25 ENCOUNTER — Other Ambulatory Visit: Payer: Self-pay | Admitting: Family Medicine

## 2019-09-25 NOTE — Telephone Encounter (Signed)
Last OV 10/2018 Next OV-not scheduled 30 day supply sent in to pharmacy [facility] for further refills

## 2019-12-14 ENCOUNTER — Other Ambulatory Visit: Payer: Self-pay | Admitting: Family Medicine

## 2019-12-18 ENCOUNTER — Other Ambulatory Visit: Payer: Self-pay | Admitting: Family Medicine

## 2019-12-23 ENCOUNTER — Other Ambulatory Visit: Payer: Self-pay | Admitting: Family Medicine

## 2019-12-23 NOTE — Telephone Encounter (Signed)
  Prescription Request  12/23/2019  What is the name of the medication or equipment? Pt is out of cholesterol meds. He made appt in oct with [facility]  Have you contacted your pharmacy to request a refill? (if applicable) yes  Which pharmacy would you like this sent to? [facility]   Patient notified that their request is being sent to the clinical staff for review and that they should receive a response within 2 business days.

## 2019-12-24 MED ORDER — ATORVASTATIN CALCIUM 40 MG PO TABS: 40.0000 mg | ORAL_TABLET | Freq: Every day | ORAL | 0 refills | Status: DC

## 2019-12-24 NOTE — Telephone Encounter (Signed)
Pt aware 30 d refill sent to pharmacy. Appt 01/07/20

## 2020-01-07 ENCOUNTER — Other Ambulatory Visit: Payer: Self-pay

## 2020-01-07 ENCOUNTER — Encounter: Payer: BC Managed Care – PPO | Admitting: Family Medicine

## 2020-01-12 ENCOUNTER — Encounter: Payer: Self-pay | Admitting: Family Medicine

## 2020-02-10 ENCOUNTER — Ambulatory Visit (INDEPENDENT_AMBULATORY_CARE_PROVIDER_SITE_OTHER): Payer: 59 | Admitting: Family Medicine

## 2020-02-10 ENCOUNTER — Ambulatory Visit: Payer: 59 | Admitting: Family Medicine

## 2020-02-10 ENCOUNTER — Other Ambulatory Visit: Payer: Self-pay

## 2020-02-10 ENCOUNTER — Encounter: Payer: Self-pay | Admitting: Family Medicine

## 2020-02-10 VITALS — BP 128/85 | HR 69 | Temp 97.0°F | Ht 66.0 in | Wt 156.4 lb

## 2020-02-10 DIAGNOSIS — Z125 Encounter for screening for malignant neoplasm of prostate: Secondary | ICD-10-CM

## 2020-02-10 DIAGNOSIS — N529 Male erectile dysfunction, unspecified: Secondary | ICD-10-CM

## 2020-02-10 DIAGNOSIS — Z0000 Encounter for general adult medical examination without abnormal findings: Secondary | ICD-10-CM

## 2020-02-10 DIAGNOSIS — E782 Mixed hyperlipidemia: Secondary | ICD-10-CM | POA: Diagnosis not present

## 2020-02-10 DIAGNOSIS — Z0001 Encounter for general adult medical examination with abnormal findings: Secondary | ICD-10-CM

## 2020-02-10 DIAGNOSIS — E291 Testicular hypofunction: Secondary | ICD-10-CM

## 2020-02-10 DIAGNOSIS — E559 Vitamin D deficiency, unspecified: Secondary | ICD-10-CM

## 2020-02-10 DIAGNOSIS — Z114 Encounter for screening for human immunodeficiency virus [HIV]: Secondary | ICD-10-CM

## 2020-02-10 DIAGNOSIS — Z1159 Encounter for screening for other viral diseases: Secondary | ICD-10-CM

## 2020-02-10 MED ORDER — ATORVASTATIN CALCIUM 40 MG PO TABS: 40.0000 mg | ORAL_TABLET | Freq: Every day | ORAL | 0 refills | Status: DC

## 2020-02-10 NOTE — Progress Notes (Signed)
Subjective:  Patient ID: Jesse Thomas, male    DOB: [birth year]  Age: 54 y.o. MRN: [id number]  CC: Annual Exam   HPI Melo Bann presents for  CPE    Depression screen [facility] 2/9 02/10/2020 11/05/2018 09/04/2018  Decreased Interest 0 0 0  Down, Depressed, Hopeless 0 0 0  PHQ - 2 Score 0 0 0  Altered sleeping - 0 -  Tired, decreased energy - 0 -  Change in appetite - 0 -  Feeling bad or failure about yourself  - 0 -  Trouble concentrating - 0 -  Moving slowly or fidgety/restless - 0 -  Suicidal thoughts - 0 -  PHQ-9 Score - 0 -    History Vidit has a past medical history of History of kidney stones, Hyperlipidemia, and Hypogonadism male.   He has a past surgical history that includes none; Colonoscopy (N/A, 01/17/2018); and polypectomy (01/17/2018).   His family history includes Dementia in his mother; Early death in his father.He reports that he has never smoked. He has never used smokeless tobacco. He reports that he does not drink alcohol and does not use drugs.    ROS Review of Systems  Constitutional: Negative for activity change, fatigue and unexpected weight change.  HENT: Negative for congestion, ear pain, hearing loss, postnasal drip and trouble swallowing.   Eyes: Negative for pain and visual disturbance.  Respiratory: Negative for cough, chest tightness and shortness of breath.   Cardiovascular: Negative for chest pain, palpitations and leg swelling.  Gastrointestinal: Negative for abdominal distention, abdominal pain, blood in stool, constipation, diarrhea, nausea and vomiting.  Endocrine: Negative for cold intolerance, heat intolerance and polydipsia.  Genitourinary: Negative for difficulty urinating, dysuria, flank pain, frequency and urgency.  Musculoskeletal: Negative for arthralgias and joint swelling.  Skin: Negative for color change, rash and wound.  Neurological: Negative for dizziness, syncope, speech difficulty, weakness, light-headedness, numbness and  headaches.  Hematological: Does not bruise/bleed easily.  Psychiatric/Behavioral: Negative for confusion, decreased concentration, dysphoric mood and sleep disturbance. The patient is not nervous/anxious.     Objective:  BP 128/85   Pulse 69   Temp (!) 97 F (36.1 C) (Temporal)   Ht '5\' 6"'  (1.676 m)   Wt 156 lb 6.4 oz (70.9 kg)   BMI 25.24 kg/m   BP Readings from Last 3 Encounters:  02/10/20 128/85  11/05/18 118/81  09/04/18 136/84    Wt Readings from Last 3 Encounters:  02/10/20 156 lb 6.4 oz (70.9 kg)  11/05/18 151 lb (68.5 kg)  09/04/18 156 lb 3.2 oz (70.9 kg)     Physical Exam Constitutional:      Appearance: He is well-developed.  HENT:     Head: Normocephalic and atraumatic.  Eyes:     Pupils: Pupils are equal, round, and reactive to light.  Neck:     Thyroid: No thyromegaly.     Trachea: No tracheal deviation.  Cardiovascular:     Rate and Rhythm: Normal rate and regular rhythm.     Heart sounds: Normal heart sounds. No murmur heard.  No friction rub. No gallop.   Pulmonary:     Breath sounds: Normal breath sounds. No wheezing or rales.  Abdominal:     General: Bowel sounds are normal. There is no distension.     Palpations: Abdomen is soft. There is no mass.     Tenderness: There is no abdominal tenderness.     Hernia: There is no hernia in the left inguinal area.  Genitourinary:  Penis: Normal.      Testes: Normal.  Musculoskeletal:        General: Normal range of motion.     Cervical back: Normal range of motion.  Lymphadenopathy:     Cervical: No cervical adenopathy.  Skin:    General: Skin is warm and dry.  Neurological:     Mental Status: He is alert and oriented to person, place, and time.       Assessment & Plan:   Catlin was seen today for annual exam.  Diagnoses and all orders for this visit:  Well adult exam -     CBC with Differential/Platelet -     CMP14+EGFR  Mixed hyperlipidemia -     atorvastatin (LIPITOR) 40 MG  tablet; Take 1 tablet (40 mg total) by mouth daily. -     Lipid panel -     CBC with Differential/Platelet -     CMP14+EGFR  Erectile dysfunction, unspecified erectile dysfunction type -     CBC with Differential/Platelet -     CMP14+EGFR  Hypogonadism in male -     CBC with Differential/Platelet -     CMP14+EGFR -     PSA, total and free  Need for hepatitis C screening test -     Hepatitis C antibody -     CBC with Differential/Platelet -     CMP14+EGFR  Encounter for screening for HIV -     HIV Antibody (routine testing w rflx) -     CBC with Differential/Platelet -     CMP14+EGFR  Screening for prostate cancer -     PSA, total and free  Vitamin D deficiency -     VITAMIN D 25 Hydroxy (Vit-D Deficiency, Fractures)       I have discontinued Gilverto Neeson's ibuprofen. I am also having him maintain his fish oil-omega-3 fatty acids, Vitamin D (Ergocalciferol), and atorvastatin.  Allergies as of 02/10/2020   No Known Allergies     Medication List       Accurate as of February 10, 2020  6:29 PM. If you have any questions, ask your nurse or doctor.        STOP taking these medications   ibuprofen 800 MG tablet Commonly known as: ADVIL Stopped by: Claretta Fraise, MD     TAKE these medications   atorvastatin 40 MG tablet Commonly known as: LIPITOR Take 1 tablet (40 mg total) by mouth daily.   fish oil-omega-3 fatty acids 1000 MG capsule Take 2-4 g by mouth daily.   Vitamin D (Ergocalciferol) 1.25 MG (50000 UNIT) Caps capsule Commonly known as: DRISDOL Take 1 capsule (50,000 Units total) by mouth 2 (two) times a week.        Follow-up: Return in about 1 year (around 02/09/2021).  Claretta Fraise, M.D.

## 2020-02-11 ENCOUNTER — Other Ambulatory Visit: Payer: Self-pay | Admitting: Family Medicine

## 2020-02-11 DIAGNOSIS — E782 Mixed hyperlipidemia: Secondary | ICD-10-CM

## 2020-02-11 LAB — CMP14+EGFR
ALT: 22 IU/L (ref 0–44)
AST: 20 IU/L (ref 0–40)
Albumin/Globulin Ratio: 1.7 (ref 1.2–2.2)
Albumin: 4.4 g/dL (ref 3.8–4.9)
Alkaline Phosphatase: 87 IU/L (ref 44–121)
BUN/Creatinine Ratio: 12 (ref 9–20)
BUN: 13 mg/dL (ref 6–24)
Bilirubin Total: 0.5 mg/dL (ref 0.0–1.2)
CO2: 26 mmol/L (ref 20–29)
Calcium: 9.5 mg/dL (ref 8.7–10.2)
Chloride: 103 mmol/L (ref 96–106)
Creatinine, Ser: 1.06 mg/dL (ref 0.76–1.27)
GFR calc Af Amer: 93 mL/min/{1.73_m2} (ref >59)
GFR calc non Af Amer: 80 mL/min/{1.73_m2} (ref >59)
Globulin, Total: 2.6 g/dL (ref 1.5–4.5)
Glucose: 99 mg/dL (ref 65–99)
Potassium: 3.7 mmol/L (ref 3.5–5.2)
Sodium: 142 mmol/L (ref 134–144)
Total Protein: 7 g/dL (ref 6.0–8.5)

## 2020-02-11 LAB — CBC WITH DIFFERENTIAL/PLATELET
Basophils Absolute: 0 10*3/uL (ref 0.0–0.2)
Basos: 1 %
EOS (ABSOLUTE): 0.1 10*3/uL (ref 0.0–0.4)
Eos: 3 %
Hematocrit: 42.5 % (ref 37.5–51.0)
Hemoglobin: 14.4 g/dL (ref 13.0–17.7)
Immature Grans (Abs): 0 10*3/uL (ref 0.0–0.1)
Immature Granulocytes: 0 %
Lymphocytes Absolute: 1.4 10*3/uL (ref 0.7–3.1)
Lymphs: 31 %
MCH: 28.1 pg (ref 26.6–33.0)
MCHC: 33.9 g/dL (ref 31.5–35.7)
MCV: 83 fL (ref 79–97)
Monocytes Absolute: 0.4 10*3/uL (ref 0.1–0.9)
Monocytes: 10 %
Neutrophils Absolute: 2.4 10*3/uL (ref 1.4–7.0)
Neutrophils: 55 %
Platelets: 204 10*3/uL (ref 150–450)
RBC: 5.13 x10E6/uL (ref 4.14–5.80)
RDW: 15.7 % — ABNORMAL HIGH (ref 11.6–15.4)
WBC: 4.4 10*3/uL (ref 3.4–10.8)

## 2020-02-11 LAB — PSA, TOTAL AND FREE
PSA, Free Pct: 61.3 %
PSA, Free: 0.49 ng/mL
Prostate Specific Ag, Serum: 0.8 ng/mL (ref 0.0–4.0)

## 2020-02-11 LAB — HIV ANTIBODY (ROUTINE TESTING W REFLEX): HIV Screen 4th Generation wRfx: NONREACTIVE

## 2020-02-11 LAB — VITAMIN D 25 HYDROXY (VIT D DEFICIENCY, FRACTURES): Vit D, 25-Hydroxy: 42.1 ng/mL (ref 30.0–100.0)

## 2020-02-11 LAB — LIPID PANEL
Chol/HDL Ratio: 3.9 ratio (ref 0.0–5.0)
Cholesterol, Total: 138 mg/dL (ref 100–199)
HDL: 35 mg/dL — ABNORMAL LOW (ref >39)
LDL Chol Calc (NIH): 77 mg/dL (ref 0–99)
Triglycerides: 147 mg/dL (ref 0–149)
VLDL Cholesterol Cal: 26 mg/dL (ref 5–40)

## 2020-02-11 LAB — HEPATITIS C ANTIBODY: Hep C Virus Ab: <0.1 s/co ratio (ref 0.0–0.9)

## 2020-02-11 NOTE — Progress Notes (Signed)
Hello Issa,  Your lab result is normal and/or stable.Some minor variations that are not significant are commonly marked abnormal, but do not represent any medical problem for you.  Best regards, Brannan Cassedy, M.D.

## 2020-02-25 ENCOUNTER — Telehealth: Payer: Self-pay

## 2020-02-25 NOTE — Telephone Encounter (Signed)
Please advise if okay?

## 2020-02-25 NOTE — Telephone Encounter (Signed)
I guess so. I've been seeing him for years. Not sure why he would want to change.Marland KitchenMarland Kitchen

## 2020-02-25 NOTE — Telephone Encounter (Signed)
Dr Livia Snellen has agreed.  Are you agreeable to take on patient?

## 2020-04-06 NOTE — Telephone Encounter (Signed)
Not taking new patients at this time.

## 2020-04-06 NOTE — Telephone Encounter (Signed)
Wife is aware

## 2020-07-28 ENCOUNTER — Other Ambulatory Visit: Payer: Self-pay

## 2020-07-28 ENCOUNTER — Ambulatory Visit (INDEPENDENT_AMBULATORY_CARE_PROVIDER_SITE_OTHER): Payer: 59 | Admitting: Nurse Practitioner

## 2020-07-28 ENCOUNTER — Encounter: Payer: Self-pay | Admitting: Nurse Practitioner

## 2020-07-28 ENCOUNTER — Ambulatory Visit: Payer: 59 | Admitting: Nurse Practitioner

## 2020-07-28 VITALS — BP 137/84 | HR 71 | Temp 98.1°F | Ht 66.0 in | Wt 148.0 lb

## 2020-07-28 DIAGNOSIS — E782 Mixed hyperlipidemia: Secondary | ICD-10-CM

## 2020-07-28 DIAGNOSIS — R21 Rash and other nonspecific skin eruption: Secondary | ICD-10-CM | POA: Insufficient documentation

## 2020-07-28 DIAGNOSIS — R0602 Shortness of breath: Secondary | ICD-10-CM | POA: Diagnosis not present

## 2020-07-28 MED ORDER — TRIAMCINOLONE ACETONIDE 0.1 % EX CREA: 1.0000 "application " | TOPICAL_CREAM | Freq: Two times a day (BID) | CUTANEOUS | 0 refills | Status: DC

## 2020-07-28 MED ORDER — ATORVASTATIN CALCIUM 40 MG PO TABS: 1.0000 | ORAL_TABLET | Freq: Every day | ORAL | 1 refills | Status: DC

## 2020-07-28 NOTE — Patient Instructions (Signed)
Rash, Adult  A rash is a change in the color of your skin. A rash can also change the way your skin feels. There are many different conditions and factors that can cause a rash. Follow these instructions at home: The goal of treatment is to stop the itching and keep the rash from spreading. Watch for any changes in your symptoms. Let your doctor know about them. Follow these instructions to help with your condition: Medicine Take or apply over-the-counter and prescription medicines only as told by your doctor. These may include medicines:  To treat red or swollen skin (corticosteroid creams).  To treat itching.  To treat an allergy (oral antihistamines).  To treat very bad symptoms (oral corticosteroids).   Skin care  Put cool cloths (compresses) on the affected areas.  Do not scratch or rub your skin.  Avoid covering the rash. Make sure that the rash is exposed to air as much as possible. Managing itching and discomfort  Avoid hot showers or baths. These can make itching worse. A cold shower may help.  Try taking a bath with: ? Epsom salts. You can get these at your local pharmacy or grocery store. Follow the instructions on the package. ? Baking soda. Pour a small amount into the bath as told by your doctor. ? Colloidal oatmeal. You can get this at your local pharmacy or grocery store. Follow the instructions on the package.  Try putting baking soda paste onto your skin. Stir water into baking soda until it gets like a paste.  Try putting on a lotion that relieves itchiness (calamine lotion).  Keep cool and out of the sun. Sweating and being hot can make itching worse. General instructions  Rest as needed.  Drink enough fluid to keep your pee (urine) pale yellow.  Wear loose-fitting clothing.  Avoid scented soaps, detergents, and perfumes. Use gentle soaps, detergents, perfumes, and other cosmetic products.  Avoid anything that causes your rash. Keep a journal to help  track what causes your rash. Write down: ? What you eat. ? What cosmetic products you use. ? What you drink. ? What you wear. This includes jewelry.  Keep all follow-up visits as told by your doctor. This is important.   Contact a doctor if:  You sweat at night.  You lose weight.  You pee (urinate) more than normal.  You pee less than normal, or you notice that your pee is a darker color than normal.  You feel weak.  You throw up (vomit).  Your skin or the whites of your eyes look yellow (jaundice).  Your skin: ? Tingles. ? Is numb.  Your rash: ? Does not go away after a few days. ? Gets worse.  You are: ? More thirsty than normal. ? More tired than normal.  You have: ? New symptoms. ? Pain in your belly (abdomen). ? A fever. ? Watery poop (diarrhea). Get help right away if:  You have a fever and your symptoms suddenly get worse.  You start to feel mixed up (confused).  You have a very bad headache or a stiff neck.  You have very bad joint pains or stiffness.  You have jerky movements that you cannot control (seizure).  Your rash covers all or most of your body. The rash may or may not be painful.  You have blisters that: ? Are on top of the rash. ? Grow larger. ? Grow together. ? Are painful. ? Are inside your nose or mouth.  You have   a rash that: ? Looks like purple pinprick-sized spots all over your body. ? Has a "bull's eye" or looks like a target. ? Is red and painful, causes your skin to peel, and is not from being in the sun too long. Summary  A rash is a change in the color of your skin. A rash can also change the way your skin feels.  The goal of treatment is to stop the itching and keep the rash from spreading.  Take or apply over-the-counter and prescription medicines only as told by your doctor.  Contact a doctor if you have new symptoms or symptoms that get worse.  Keep all follow-up visits as told by your doctor. This is  important. This information is not intended to replace advice given to you by your health care provider. Make sure you discuss any questions you have with your health care provider. Document Revised: 07/06/2018 Document Reviewed: 10/16/2017 Elsevier Patient Education  2021 [employer]. Shortness of Breath, Adult Shortness of breath means you have trouble breathing. Shortness of breath could be a sign of a medical problem. Follow these instructions at home:  Watch for any changes in your symptoms.  Do not use any products that contain nicotine or tobacco, such as cigarettes, e-cigarettes, and chewing tobacco.  Do not smoke. Smoking can cause shortness of breath. If you need help to quit smoking, ask your doctor.  Avoid things that can make it harder to breathe, such as: ? Mold. ? Dust. ? Air pollution. ? Chemical smells. ? Things that can cause allergy symptoms (allergens), if you have allergies.  Keep your living space clean. Use products that help remove mold and dust.  Rest as needed. Slowly return to your normal activities.  Take over-the-counter and prescription medicines only as told by your doctor. This includes oxygen therapy and inhaled medicines.  Keep all follow-up visits as told by your doctor. This is important.   Contact a doctor if:  Your condition does not get better as soon as expected.  You have a hard time doing your normal activities, even after you rest.  You have new symptoms. Get help right away if:  Your shortness of breath gets worse.  You have trouble breathing when you are resting.  You feel light-headed or you pass out (faint).  You have a cough that is not helped by medicines.  You cough up blood.  You have pain with breathing.  You have pain in your chest, arms, shoulders, or belly (abdomen).  You have a fever.  You cannot walk up stairs.  You cannot exercise the way you normally do. These symptoms may represent a serious problem  that is an emergency. Do not wait to see if the symptoms will go away. Get medical help right away. Call your local emergency services (911 in the [place] Do not drive yourself to the hospital. Summary  Shortness of breath is when you have trouble breathing enough air. It can be a sign of a medical problem.  Avoid things that make it hard for you to breathe, such as smoking, pollution, mold, and dust.  Watch for any changes in your symptoms. Contact your doctor if you do not get better or you get worse. This information is not intended to replace advice given to you by your health care provider. Make sure you discuss any questions you have with your health care provider. Document Revised: 08/14/2017 Document Reviewed: 08/14/2017 Elsevier Patient Education  2021 [employer].

## 2020-07-28 NOTE — Assessment & Plan Note (Signed)
Patient is a 55 year old male who presents to clinic with complaints of shortness of breath.  Patient is reporting that symptoms are worse when he lays down at night.  Patient has no history of asthma or COPD.  Patient does take an allergy pill every day for allergic rhinitis.  Patient reports that he feels a choking sensation and increased secretion and cough when he lays down at night, patient also reports that symptoms worsened after taking COVID-19 shot 3 months ago.  Patient reports father had the same problem and died.  Unfortunately patient is not able to say exactly what was his father's diagnosis, I have referred patient to pulmonary for reassessment and reevaluation.

## 2020-07-28 NOTE — Progress Notes (Signed)
Acute Office Visit  Subjective:    Patient ID: Jesse Thomas, male    DOB: [birth year], 55 y.o.   MRN: [id number]  Chief Complaint  Patient presents with  . Shortness of Breath    Shortness of Breath This is a new problem. The current episode started more than 1 month ago. The problem occurs intermittently. The problem has been unchanged. Associated symptoms include sputum production. The symptoms are aggravated by lying flat. The patient has no known risk factors for DVT/PE. He has tried nothing for the symptoms.     Past Medical History:  Diagnosis Date  . History of kidney stones   . Hyperlipidemia   . Hypogonadism male     Past Surgical History:  Procedure Laterality Date  . COLONOSCOPY N/A 01/17/2018   Procedure: COLONOSCOPY;  Surgeon: Daneil Dolin, MD;  Location: [facility];  Service: Endoscopy;  Laterality: N/A;  9:00  . none    . POLYPECTOMY  01/17/2018   Procedure: POLYPECTOMY;  Surgeon: Daneil Dolin, MD;  Location: [facility];  Service: Endoscopy;;  colon    Family History  Problem Relation Age of Onset  . Dementia Mother   . Early death Father     Social History   Socioeconomic History  . Marital status: Married    Spouse name: Not on file  . Number of children: Not on file  . Years of education: Not on file  . Highest education level: Not on file  Occupational History  . Not on file  Tobacco Use  . Smoking status: Never Smoker  . Smokeless tobacco: Never Used  Vaping Use  . Vaping Use: Never used  Substance and Sexual Activity  . Alcohol use: Never  . Drug use: Never  . Sexual activity: Yes    Birth control/protection: None  Other Topics Concern  . Not on file  Social History Narrative  . Not on file   Social Determinants of Health   Financial Resource Strain: Not on file  Food Insecurity: Not on file  Transportation Needs: Not on file  Physical Activity: Not on file  Stress: Not on file  Social Connections: Not on file   Intimate Partner Violence: Not on file    Outpatient Medications Prior to Visit  Medication Sig Dispense Refill  . atorvastatin (LIPITOR) 40 MG tablet Take 1 tablet by mouth once daily 30 tablet 4  . fish oil-omega-3 fatty acids 1000 MG capsule Take 2-4 g by mouth daily.     . Vitamin D, Ergocalciferol, (DRISDOL) 50000 units CAPS capsule Take 1 capsule (50,000 Units total) by mouth 2 (two) times a week. 16 capsule 0   No facility-administered medications prior to visit.    Review of Systems  Constitutional: Negative.   HENT: Negative.   Respiratory: Positive for sputum production, choking and shortness of breath.   Gastrointestinal: Negative.   Musculoskeletal: Negative.   All other systems reviewed and are negative.      Objective:    Physical Exam Vitals reviewed.  Constitutional:      Appearance: He is well-developed.  HENT:     Head: Normocephalic.     Nose: Nose normal.  Eyes:     Conjunctiva/sclera: Conjunctivae normal.  Cardiovascular:     Rate and Rhythm: Normal rate and regular rhythm.     Pulses: Normal pulses.     Heart sounds: Normal heart sounds.  Pulmonary:     Effort: Pulmonary effort is normal.  Breath sounds: Normal breath sounds. No decreased breath sounds or wheezing.  Abdominal:     General: Bowel sounds are normal.  Musculoskeletal:        General: Normal range of motion.  Skin:    Findings: No rash.  Neurological:     Mental Status: He is alert and oriented to person, place, and time.     BP 137/84   Pulse 71   Temp 98.1 F (36.7 C) (Temporal)   Ht 5\' 6"  (1.676 m)   Wt 148 lb (67.1 kg)   SpO2 98%   BMI 23.89 kg/m  Wt Readings from Last 3 Encounters:  07/28/20 148 lb (67.1 kg)  02/10/20 156 lb 6.4 oz (70.9 kg)  11/05/18 151 lb (68.5 kg)    Health Maintenance Due  Topic Date Due  . COVID-19 Vaccine (1) Never done    There are no preventive care reminders to display for this patient.   Lab Results  Component Value Date    TSH 1.110 09/04/2018   Lab Results  Component Value Date   WBC 4.4 02/10/2020   HGB 14.4 02/10/2020   HCT 42.5 02/10/2020   MCV 83 02/10/2020   PLT 204 02/10/2020   Lab Results  Component Value Date   NA 142 02/10/2020   K 3.7 02/10/2020   CO2 26 02/10/2020   GLUCOSE 99 02/10/2020   BUN 13 02/10/2020   CREATININE 1.06 02/10/2020   BILITOT 0.5 02/10/2020   ALKPHOS 87 02/10/2020   AST 20 02/10/2020   ALT 22 02/10/2020   PROT 7.0 02/10/2020   ALBUMIN 4.4 02/10/2020   CALCIUM 9.5 02/10/2020   Lab Results  Component Value Date   CHOL 138 02/10/2020   Lab Results  Component Value Date   HDL 35 (L) 02/10/2020   Lab Results  Component Value Date   LDLCALC 77 02/10/2020   Lab Results  Component Value Date   TRIG 147 02/10/2020   Lab Results  Component Value Date   CHOLHDL 3.9 02/10/2020   No results found for: HGBA1C     Assessment & Plan:   Problem List Items Addressed This Visit      Musculoskeletal and Integument   Rash - Primary    Patient is reporting unresolved rash on left thigh.  On assessment rash looks like Tinea [ringworm]  Triamcinolone cream ordered, Rx sent to pharmacy.  Advised patient to follow-up with worsening or unresolved symptoms.      Relevant Medications   triamcinolone cream (KENALOG) 0.1 %     Other   Mixed hyperlipidemia   Relevant Medications   atorvastatin (LIPITOR) 40 MG tablet   SOB (shortness of breath)    Patient is a 55 year old male who presents to clinic with complaints of shortness of breath.  Patient is reporting that symptoms are worse when he lays down at night.  Patient has no history of asthma or COPD.  Patient does take an allergy pill every day for allergic rhinitis.  Patient reports that he feels a choking sensation and increased secretion and cough when he lays down at night, patient also reports that symptoms worsened after taking COVID-19 shot 3 months ago.  Patient reports father had the same problem and  died.  Unfortunately patient is not able to say exactly what was his father's diagnosis, I have referred patient to pulmonary for reassessment and reevaluation.        Relevant Orders   Ambulatory referral to Pulmonology  Meds ordered this encounter  Medications  . atorvastatin (LIPITOR) 40 MG tablet    Sig: Take 1 tablet (40 mg total) by mouth daily.    Dispense:  30 tablet    Refill:  1    Order Specific Question:   Supervising Provider    Answer:   Janora Norlander [[id number]]  . triamcinolone cream (KENALOG) 0.1 %    Sig: Apply 1 application topically 2 (two) times daily.    Dispense:  30 g    Refill:  0    Order Specific Question:   Supervising Provider    Answer:   Janora Norlander [[id number]]     Ivy Lynn, NP

## 2020-07-28 NOTE — Assessment & Plan Note (Signed)
Patient is reporting unresolved rash on left thigh.  On assessment rash looks like Tinea [ringworm]  Triamcinolone cream ordered, Rx sent to pharmacy.  Advised patient to follow-up with worsening or unresolved symptoms.

## 2020-08-21 ENCOUNTER — Ambulatory Visit: Payer: BLUE CROSS/BLUE SHIELD | Admitting: Family Medicine

## 2020-11-18 ENCOUNTER — Other Ambulatory Visit: Payer: Self-pay | Admitting: Family Medicine

## 2020-11-18 DIAGNOSIS — E782 Mixed hyperlipidemia: Secondary | ICD-10-CM

## 2020-11-18 MED ORDER — ATORVASTATIN CALCIUM 40 MG PO TABS: 40.0000 mg | ORAL_TABLET | Freq: Every day | ORAL | 1 refills | Status: DC

## 2020-11-19 ENCOUNTER — Ambulatory Visit: Payer: 59 | Admitting: Family Medicine

## 2020-11-25 ENCOUNTER — Encounter: Payer: Self-pay | Admitting: Family Medicine

## 2020-11-25 ENCOUNTER — Other Ambulatory Visit: Payer: Self-pay

## 2020-11-25 ENCOUNTER — Ambulatory Visit (INDEPENDENT_AMBULATORY_CARE_PROVIDER_SITE_OTHER): Payer: 59 | Admitting: Family Medicine

## 2020-11-25 DIAGNOSIS — E782 Mixed hyperlipidemia: Secondary | ICD-10-CM | POA: Diagnosis not present

## 2020-11-25 MED ORDER — ATORVASTATIN CALCIUM 40 MG PO TABS: 40.0000 mg | ORAL_TABLET | Freq: Every day | ORAL | 5 refills | Status: DC

## 2020-11-25 NOTE — Progress Notes (Signed)
Subjective:  Patient ID: Jesse Thomas, male    DOB: [birth year]  Age: 55 y.o. MRN: [id number]  CC: Medical Management of Chronic Issues   HPI Jesse Thomas presents for follow-up of elevated cholesterol. Doing well without complaints on current medication. Denies side effects of statin including myalgia and arthralgia and nausea. Also in today for liver function testing. Currently no chest pain, shortness of breath or other cardiovascular related symptoms noted. History Jesse Thomas has a past medical history of History of kidney stones, Hyperlipidemia, and Hypogonadism male.   Jesse Thomas has a past surgical history that includes none; Colonoscopy (N/A, 01/17/2018); and polypectomy (01/17/2018).   His family history includes Dementia in his mother; Early death in his father.Jesse Thomas reports that Jesse Thomas has never smoked. Jesse Thomas has never used smokeless tobacco. Jesse Thomas reports that Jesse Thomas does not drink alcohol and does not use drugs.  Current Outpatient Medications on File Prior to Visit  Medication Sig Dispense Refill   fish oil-omega-3 fatty acids 1000 MG capsule Take 2-4 g by mouth daily.  (Patient not taking: Reported on 11/25/2020)     triamcinolone cream (KENALOG) 0.1 % Apply 1 application topically 2 (two) times daily. 30 g 0   Vitamin D, Ergocalciferol, (DRISDOL) 50000 units CAPS capsule Take 1 capsule (50,000 Units total) by mouth 2 (two) times a week. (Patient not taking: Reported on 11/25/2020) 16 capsule 0   No current facility-administered medications on file prior to visit.    ROS Review of Systems  Constitutional:  Negative for fever.  Respiratory:  Negative for shortness of breath.   Cardiovascular:  Negative for chest pain.  Musculoskeletal:  Negative for arthralgias.  Skin:  Negative for rash.   Objective:  BP 129/80   Pulse 75   Temp (!) 97.5 F (36.4 C) (Temporal)   Resp 20   Ht '5\' 6"'  (1.676 m)   Wt 151 lb (68.5 kg)   SpO2 95%   BMI 24.37 kg/m   BP Readings from Last 3 Encounters:   11/25/20 129/80  07/28/20 137/84  02/10/20 128/85    Wt Readings from Last 3 Encounters:  11/25/20 151 lb (68.5 kg)  07/28/20 148 lb (67.1 kg)  02/10/20 156 lb 6.4 oz (70.9 kg)     Physical Exam Vitals reviewed.  Constitutional:      Appearance: Jesse Thomas is well-developed.  HENT:     Head: Normocephalic and atraumatic.     Right Ear: External ear normal.     Left Ear: External ear normal.     Mouth/Throat:     Pharynx: No oropharyngeal exudate or posterior oropharyngeal erythema.  Eyes:     Pupils: Pupils are equal, round, and reactive to light.  Cardiovascular:     Rate and Rhythm: Normal rate and regular rhythm.     Heart sounds: No murmur heard. Pulmonary:     Effort: No respiratory distress.     Breath sounds: Normal breath sounds.  Musculoskeletal:     Cervical back: Normal range of motion and neck supple.  Neurological:     Mental Status: Jesse Thomas is alert and oriented to person, place, and time.    No results found for: HGBA1C  Lab Results  Component Value Date   WBC 4.4 02/10/2020   HGB 14.4 02/10/2020   HCT 42.5 02/10/2020   PLT 204 02/10/2020   GLUCOSE 99 02/10/2020   CHOL 138 02/10/2020   TRIG 147 02/10/2020   HDL 35 (L) 02/10/2020   LDLCALC 77 02/10/2020   ALT 22  02/10/2020   AST 20 02/10/2020   NA 142 02/10/2020   K 3.7 02/10/2020   CL 103 02/10/2020   CREATININE 1.06 02/10/2020   BUN 13 02/10/2020   CO2 26 02/10/2020   TSH 1.110 09/04/2018    No results found.  Assessment & Plan:   Jesse Thomas was seen today for medical management of chronic issues.  Diagnoses and all orders for this visit:  Mixed hyperlipidemia -     atorvastatin (LIPITOR) 40 MG tablet; Take 1 tablet (40 mg total) by mouth daily. -     CMP14+EGFR -     Lipid panel  I am having Jesse Thomas maintain his fish oil-omega-3 fatty acids, Vitamin D (Ergocalciferol), triamcinolone cream, and atorvastatin.  Meds ordered this encounter  Medications   atorvastatin (LIPITOR) 40 MG  tablet    Sig: Take 1 tablet (40 mg total) by mouth daily.    Dispense:  30 tablet    Refill:  5    Low vitamin D in past. Concerned about cost of lab. Since it was good last time and Jesse Thomas is taking 5000 units daily. I suggested that Jesse Thomas wouldn't need bloowork for that this time.   Follow-up: Return in about 6 months (around 05/25/2021) for [facility] physical.  Claretta Fraise, M.D.

## 2020-11-26 LAB — CMP14+EGFR
ALT: 22 IU/L (ref 0–44)
AST: 23 IU/L (ref 0–40)
Albumin/Globulin Ratio: 1.8 (ref 1.2–2.2)
Albumin: 4.4 g/dL (ref 3.8–4.9)
Alkaline Phosphatase: 90 IU/L (ref 44–121)
BUN/Creatinine Ratio: 12 (ref 9–20)
BUN: 14 mg/dL (ref 6–24)
Bilirubin Total: 0.6 mg/dL (ref 0.0–1.2)
CO2: 27 mmol/L (ref 20–29)
Calcium: 9.3 mg/dL (ref 8.7–10.2)
Chloride: 101 mmol/L (ref 96–106)
Creatinine, Ser: 1.19 mg/dL (ref 0.76–1.27)
Globulin, Total: 2.4 g/dL (ref 1.5–4.5)
Glucose: 83 mg/dL (ref 65–99)
Potassium: 4.5 mmol/L (ref 3.5–5.2)
Sodium: 140 mmol/L (ref 134–144)
Total Protein: 6.8 g/dL (ref 6.0–8.5)
eGFR: 73 mL/min/{1.73_m2} (ref >59)

## 2020-11-26 LAB — LIPID PANEL
Chol/HDL Ratio: 3.4 ratio (ref 0.0–5.0)
Cholesterol, Total: 128 mg/dL (ref 100–199)
HDL: 38 mg/dL — ABNORMAL LOW (ref >39)
LDL Chol Calc (NIH): 71 mg/dL (ref 0–99)
Triglycerides: 103 mg/dL (ref 0–149)
VLDL Cholesterol Cal: 19 mg/dL (ref 5–40)

## 2020-11-26 NOTE — Progress Notes (Signed)
Hello Jesse Thomas,  Your lab result is normal and/or stable.Some minor variations that are not significant are commonly marked abnormal, but do not represent any medical problem for you.  Best regards, Vedanshi Massaro, M.D.

## 2020-12-02 ENCOUNTER — Encounter: Payer: Self-pay | Admitting: *Deleted

## 2020-12-09 ENCOUNTER — Telehealth: Payer: Self-pay | Admitting: Family Medicine

## 2020-12-09 ENCOUNTER — Other Ambulatory Visit: Payer: Self-pay | Admitting: *Deleted

## 2020-12-09 DIAGNOSIS — E782 Mixed hyperlipidemia: Secondary | ICD-10-CM

## 2020-12-09 MED ORDER — ATORVASTATIN CALCIUM 40 MG PO TABS: 40.0000 mg | ORAL_TABLET | Freq: Every day | ORAL | 5 refills | Status: DC

## 2020-12-09 NOTE — Telephone Encounter (Signed)
Sent as requested.

## 2021-01-19 ENCOUNTER — Ambulatory Visit (INDEPENDENT_AMBULATORY_CARE_PROVIDER_SITE_OTHER): Payer: 59 | Admitting: Nurse Practitioner

## 2021-01-19 ENCOUNTER — Encounter: Payer: Self-pay | Admitting: Nurse Practitioner

## 2021-01-19 DIAGNOSIS — J069 Acute upper respiratory infection, unspecified: Secondary | ICD-10-CM

## 2021-01-19 MED ORDER — PROMETHAZINE-DM 6.25-15 MG/5ML PO SYRP: 5.0000 mL | ORAL_SOLUTION | Freq: Four times a day (QID) | ORAL | 0 refills | Status: DC | PRN

## 2021-01-19 MED ORDER — AMOXICILLIN-POT CLAVULANATE 875-125 MG PO TABS: 1.0000 | ORAL_TABLET | Freq: Two times a day (BID) | ORAL | 0 refills | Status: DC

## 2021-01-19 NOTE — Progress Notes (Signed)
Virtual Visit  Note Due to COVID-19 pandemic this visit was conducted virtually. This visit type was conducted due to national recommendations for restrictions regarding the COVID-19 Pandemic (e.g. social distancing, sheltering in place) in an effort to limit this patient's exposure and mitigate transmission in our community. All issues noted in this document were discussed and addressed.  A physical exam was not performed with this format.  I connected with Jesse Thomas on 01/19/21 at 8:01 by telephone and verified that I am speaking with the correct person using two identifiers. Jesse Thomas is currently located at home and her husband is currently with her during visit. The provider, Mary-Margaret Hassell Done, FNP is located in their office at time of visit.  I discussed the limitations, risks, security and privacy concerns of performing an evaluation and management service by telephone and the availability of in person appointments. I also discussed with the patient that there may be a patient responsible charge related to this service. The patient expressed understanding and agreed to proceed.   History and Present Illness:  Patient states that he has been sick for over a week. Started out as cough and ongestion. The cough has become productive and he is feeling a little achy. He has been taking dayquil and nightquil which have helped some.  Mainly spoke with wife  Review of Systems  Constitutional:  Positive for malaise/fatigue. Negative for chills and fever.  HENT:  Positive for congestion. Negative for sore throat.   Respiratory:  Positive for cough and sputum production. Negative for shortness of breath.   Musculoskeletal:  Positive for myalgias.  Neurological:  Positive for headaches.    Observations/Objective: Alert and oriented- answers all questions appropriately No distress Voice hoarse Slight cough noted  Assessment and Plan: Jesse Thomas in today with chief complaint of  No chief complaint on file.   1. URI with cough and congestion 1. Take meds as prescribed 2. Use a cool mist humidifier especially during the winter months and when heat has been humid. 3. Use saline nose sprays frequently 4. Saline irrigations of the nose can be very helpful if done frequently.  * 4X daily for 1 week*  * Use of a nettie pot can be helpful with this. Follow directions with this* 5. Drink plenty of fluids 6. Keep thermostat turn down low 7.For any cough or congestion  Use plain Mucinex- regular strength or max strength is fine   * Children- consult with Pharmacist for dosing 8. For fever or aces or pains- take tylenol or ibuprofen appropriate for age and weight.  * for fevers greater than 101 orally you may alternate ibuprofen and tylenol every  3 hours.   Meds ordered this encounter  Medications   amoxicillin-clavulanate (AUGMENTIN) 875-125 MG tablet    Sig: Take 1 tablet by mouth 2 (two) times daily.    Dispense:  14 tablet    Refill:  0    Order Specific Question:   Supervising Provider    Answer:   Caryl Pina A [[id number]]   promethazine-dextromethorphan (PROMETHAZINE-DM) 6.25-15 MG/5ML syrup    Sig: Take 5 mLs by mouth 4 (four) times daily as needed for cough.    Dispense:  118 mL    Refill:  0    Order Specific Question:   Supervising Provider    Answer:   Caryl Pina A [[id number]]       Follow Up Instructions: prn    I discussed the assessment and treatment plan with the patient.  The patient was provided an opportunity to ask questions and all were answered. The patient agreed with the plan and demonstrated an understanding of the instructions.   The patient was advised to call back or seek an in-person evaluation if the symptoms worsen or if the condition fails to improve as anticipated.  The above assessment and management plan was discussed with the patient. The patient verbalized understanding of and has agreed to the management plan.  Patient is aware to call the clinic if symptoms persist or worsen. Patient is aware when to return to the clinic for a follow-up visit. Patient educated on when it is appropriate to go to the emergency department.   Time call ended:  8:13  I provided 12 minutes of  non face-to-face time during this encounter.    Mary-Margaret Hassell Done, FNP

## 2021-01-19 NOTE — Addendum Note (Signed)
Addended by: Rolena Infante on: 01/19/2021 08:41 AM   Modules accepted: Orders

## 2021-02-01 ENCOUNTER — Encounter: Payer: Self-pay | Admitting: Family Medicine

## 2021-05-20 DIAGNOSIS — R053 Chronic cough: Secondary | ICD-10-CM | POA: Diagnosis not present

## 2021-05-20 DIAGNOSIS — J3089 Other allergic rhinitis: Secondary | ICD-10-CM | POA: Diagnosis not present

## 2021-06-02 ENCOUNTER — Telehealth: Payer: Self-pay | Admitting: Family Medicine

## 2021-06-03 ENCOUNTER — Other Ambulatory Visit: Payer: Self-pay

## 2021-06-03 ENCOUNTER — Ambulatory Visit
Admission: RE | Admit: 2021-06-03 | Discharge: 2021-06-03 | Disposition: A | Discharge status: Home / Self Care | Payer: Self-pay | Source: Ambulatory Visit | Attending: Internal Medicine | Admitting: Internal Medicine

## 2021-06-03 ENCOUNTER — Other Ambulatory Visit: Payer: Self-pay | Admitting: Internal Medicine

## 2021-06-03 DIAGNOSIS — R053 Chronic cough: Secondary | ICD-10-CM | POA: Diagnosis not present

## 2021-06-03 NOTE — Telephone Encounter (Signed)
Spoke to patient - he is going to go to [facility]  ?

## 2021-06-17 DIAGNOSIS — J3089 Other allergic rhinitis: Secondary | ICD-10-CM | POA: Diagnosis not present

## 2021-06-25 DIAGNOSIS — R053 Chronic cough: Secondary | ICD-10-CM | POA: Diagnosis not present

## 2021-06-25 DIAGNOSIS — J3089 Other allergic rhinitis: Secondary | ICD-10-CM | POA: Diagnosis not present

## 2021-12-25 ENCOUNTER — Other Ambulatory Visit: Payer: Self-pay | Admitting: Family Medicine

## 2021-12-25 DIAGNOSIS — E782 Mixed hyperlipidemia: Secondary | ICD-10-CM

## 2021-12-28 ENCOUNTER — Other Ambulatory Visit: Payer: Self-pay | Admitting: Family Medicine

## 2021-12-28 DIAGNOSIS — E782 Mixed hyperlipidemia: Secondary | ICD-10-CM

## 2021-12-28 MED ORDER — ATORVASTATIN CALCIUM 40 MG PO TABS: 40.0000 mg | ORAL_TABLET | Freq: Every day | ORAL | 0 refills | Status: DC

## 2021-12-28 NOTE — Telephone Encounter (Signed)
  Prescription Request  12/28/2021  Is this a "Controlled Substance" medicine? no  Have you seen your PCP in the last 2 weeks? no  If YES, route message to pool  -  If NO, patient needs to be scheduled for appointment.  What is the name of the medication or equipment? atorvastatin (LIPITOR) 40 MG tablet  Have you contacted your pharmacy to request a refill? yes   Which pharmacy would you like this sent to? [facility], [place] [address] (Ph: [phone])   Patient notified that their request is being sent to the clinical staff for review and that they should receive a response within 2 business days.

## 2021-12-29 NOTE — Telephone Encounter (Signed)
Pt aware refill sent to pharmacy 

## 2022-01-31 ENCOUNTER — Encounter: Payer: Self-pay | Admitting: Family Medicine

## 2022-01-31 ENCOUNTER — Ambulatory Visit: Payer: BC Managed Care – PPO | Admitting: Family Medicine

## 2022-01-31 VITALS — BP 123/72 | HR 64 | Temp 98.0°F | Ht 66.0 in | Wt 153.0 lb

## 2022-01-31 DIAGNOSIS — E782 Mixed hyperlipidemia: Secondary | ICD-10-CM

## 2022-01-31 MED ORDER — ATORVASTATIN CALCIUM 40 MG PO TABS: 40.0000 mg | ORAL_TABLET | Freq: Every day | ORAL | 3 refills | Status: DC

## 2022-01-31 MED ORDER — ATORVASTATIN CALCIUM 40 MG PO TABS: 40.0000 mg | ORAL_TABLET | Freq: Every day | ORAL | 0 refills | Status: DC

## 2022-01-31 NOTE — Progress Notes (Signed)
Subjective:  Patient ID: Jesse Thomas, male    DOB: 05-22-1965  Age: 56 y.o. MRN: [id number]  CC: Medical Management of Chronic Issues   HPI Jesse Thomas presents for follow-up of elevated cholesterol. Doing well without complaints on current medication. Denies side effects of statin including myalgia and arthralgia and nausea. Also in today for liver function testing. Currently no chest pain, shortness of breath or other cardiovascular related symptoms noted. History Jesse Thomas has a past medical history of History of kidney stones, Hyperlipidemia, and Hypogonadism male.   He has a past surgical history that includes none; Colonoscopy (N/A, 01/17/2018); and polypectomy (01/17/2018).   His family history includes Dementia in his mother; Early death in his father.He reports that he has never smoked. He has never used smokeless tobacco. He reports that he does not drink alcohol and does not use drugs.  No current outpatient medications on file prior to visit.   No current facility-administered medications on file prior to visit.    ROS Review of Systems  Constitutional:  Negative for fever.  Respiratory:  Negative for shortness of breath.   Cardiovascular:  Negative for chest pain.  Musculoskeletal:  Negative for arthralgias.  Skin:  Negative for rash.    Objective:  BP 123/72   Pulse 64   Temp 98 F (36.7 C)   Ht _0  (1.676 m)   Wt 153 lb (69.4 kg)   SpO2 94%   BMI 24.69 kg/m   BP Readings from Last 3 Encounters:  01/31/22 123/72  11/25/20 129/80  07/28/20 137/84    Wt Readings from Last 3 Encounters:  01/31/22 153 lb (69.4 kg)  11/25/20 151 lb (68.5 kg)  07/28/20 148 lb (67.1 kg)     Physical Exam Vitals reviewed.  Constitutional:      Appearance: He is well-developed.  HENT:     Head: Normocephalic and atraumatic.     Right Ear: External ear normal.     Left Ear: External ear normal.     Mouth/Throat:     Pharynx: No oropharyngeal exudate or posterior  oropharyngeal erythema.  Eyes:     Pupils: Pupils are equal, round, and reactive to light.  Cardiovascular:     Rate and Rhythm: Normal rate and regular rhythm.     Heart sounds: No murmur heard. Pulmonary:     Effort: No respiratory distress.     Breath sounds: Normal breath sounds.  Musculoskeletal:     Cervical back: Normal range of motion and neck supple.  Neurological:     Mental Status: He is alert and oriented to person, place, and time.     No results found for: "HGBA1C"  Lab Results  Component Value Date   WBC 4.4 02/10/2020   HGB 14.4 02/10/2020   HCT 42.5 02/10/2020   PLT 204 02/10/2020   GLUCOSE 83 11/25/2020   CHOL 128 11/25/2020   TRIG 103 11/25/2020   HDL 38 (L) 11/25/2020   LDLCALC 71 11/25/2020   ALT 22 11/25/2020   AST 23 11/25/2020   NA 140 11/25/2020   K 4.5 11/25/2020   CL 101 11/25/2020   CREATININE 1.19 11/25/2020   BUN 14 11/25/2020   CO2 27 11/25/2020   TSH 1.110 09/04/2018    DG Chest 2 View  Result Date: 06/03/2021 CLINICAL DATA:  Chronic cough. EXAM: CHEST - 2 VIEW COMPARISON:  None. FINDINGS: The heart size and mediastinal contours are within normal limits. Both lungs are clear. The visualized skeletal structures are  unremarkable. IMPRESSION: No active cardiopulmonary disease. Electronically Signed   By: Ronney Asters M.D.   On: 06/03/2021 20:55    Assessment & Plan:   [name] was seen today for medical management of chronic issues.  Diagnoses and all orders for this visit:  Mixed hyperlipidemia -     CBC with Differential/Platelet -     CMP14+EGFR -     Lipid panel -     Discontinue: atorvastatin (LIPITOR) 40 MG tablet; Take 1 tablet (40 mg total) by mouth daily. -     atorvastatin (LIPITOR) 40 MG tablet; Take 1 tablet (40 mg total) by mouth daily.   I have discontinued Jesse Thomas's fish oil-omega-3 fatty acids, Vitamin D (Ergocalciferol), triamcinolone cream, amoxicillin-clavulanate, and promethazine-dextromethorphan. I am  also having him maintain his atorvastatin.  Meds ordered this encounter  Medications   DISCONTD: atorvastatin (LIPITOR) 40 MG tablet    Sig: Take 1 tablet (40 mg total) by mouth daily.    Dispense:  90 tablet    Refill:  3   atorvastatin (LIPITOR) 40 MG tablet    Sig: Take 1 tablet (40 mg total) by mouth daily.    Dispense:  365 tablet    Refill:  0     Follow-up: Return in about 6 months (around 08/01/2022) for [facility] physical.  Jesse Thomas, M.D.

## 2022-02-01 LAB — CMP14+EGFR
ALT: 28 IU/L (ref 0–44)
AST: 22 IU/L (ref 0–40)
Albumin/Globulin Ratio: 1.9 (ref 1.2–2.2)
Albumin: 4.4 g/dL (ref 3.8–4.9)
Alkaline Phosphatase: 94 IU/L (ref 44–121)
BUN/Creatinine Ratio: 10 (ref 9–20)
BUN: 11 mg/dL (ref 6–24)
Bilirubin Total: 0.6 mg/dL (ref 0.0–1.2)
CO2: 25 mmol/L (ref 20–29)
Calcium: 8.9 mg/dL (ref 8.7–10.2)
Chloride: 103 mmol/L (ref 96–106)
Creatinine, Ser: 1.12 mg/dL (ref 0.76–1.27)
Globulin, Total: 2.3 g/dL (ref 1.5–4.5)
Glucose: 82 mg/dL (ref 70–99)
Potassium: 3.9 mmol/L (ref 3.5–5.2)
Sodium: 143 mmol/L (ref 134–144)
Total Protein: 6.7 g/dL (ref 6.0–8.5)
eGFR: 78 mL/min/{1.73_m2} (ref >59)

## 2022-02-01 LAB — CBC WITH DIFFERENTIAL/PLATELET
Basophils Absolute: 0.1 10*3/uL (ref 0.0–0.2)
Basos: 2 %
EOS (ABSOLUTE): 0.3 10*3/uL (ref 0.0–0.4)
Eos: 9 %
Hematocrit: 40.3 % (ref 37.5–51.0)
Hemoglobin: 13.8 g/dL (ref 13.0–17.7)
Immature Grans (Abs): 0 10*3/uL (ref 0.0–0.1)
Immature Granulocytes: 0 %
Lymphocytes Absolute: 1.5 10*3/uL (ref 0.7–3.1)
Lymphs: 39 %
MCH: 29.4 pg (ref 26.6–33.0)
MCHC: 34.2 g/dL (ref 31.5–35.7)
MCV: 86 fL (ref 79–97)
Monocytes Absolute: 0.5 10*3/uL (ref 0.1–0.9)
Monocytes: 12 %
Neutrophils Absolute: 1.5 10*3/uL (ref 1.4–7.0)
Neutrophils: 38 %
Platelets: 197 10*3/uL (ref 150–450)
RBC: 4.7 x10E6/uL (ref 4.14–5.80)
RDW: 14.8 % (ref 11.6–15.4)
WBC: 3.8 10*3/uL (ref 3.4–10.8)

## 2022-02-01 LAB — LIPID PANEL
Chol/HDL Ratio: 3.8 ratio (ref 0.0–5.0)
Cholesterol, Total: 139 mg/dL (ref 100–199)
HDL: 37 mg/dL — ABNORMAL LOW (ref >39)
LDL Chol Calc (NIH): 78 mg/dL (ref 0–99)
Triglycerides: 133 mg/dL (ref 0–149)
VLDL Cholesterol Cal: 24 mg/dL (ref 5–40)

## 2022-02-02 NOTE — Progress Notes (Signed)
Hello Kristy,  Your lab result is normal and/or stable.Some minor variations that are not significant are commonly marked abnormal, but do not represent any medical problem for you.  Best regards, Claretta Fraise, M.D.

## 2022-04-05 IMAGING — CR DG CHEST 2V
2 series · 2 of 2 positions shown · non-contrast
Comparison: None.

CLINICAL DATA: Chronic cough.

EXAM:
CHEST - 2 VIEW

[w chest pa]
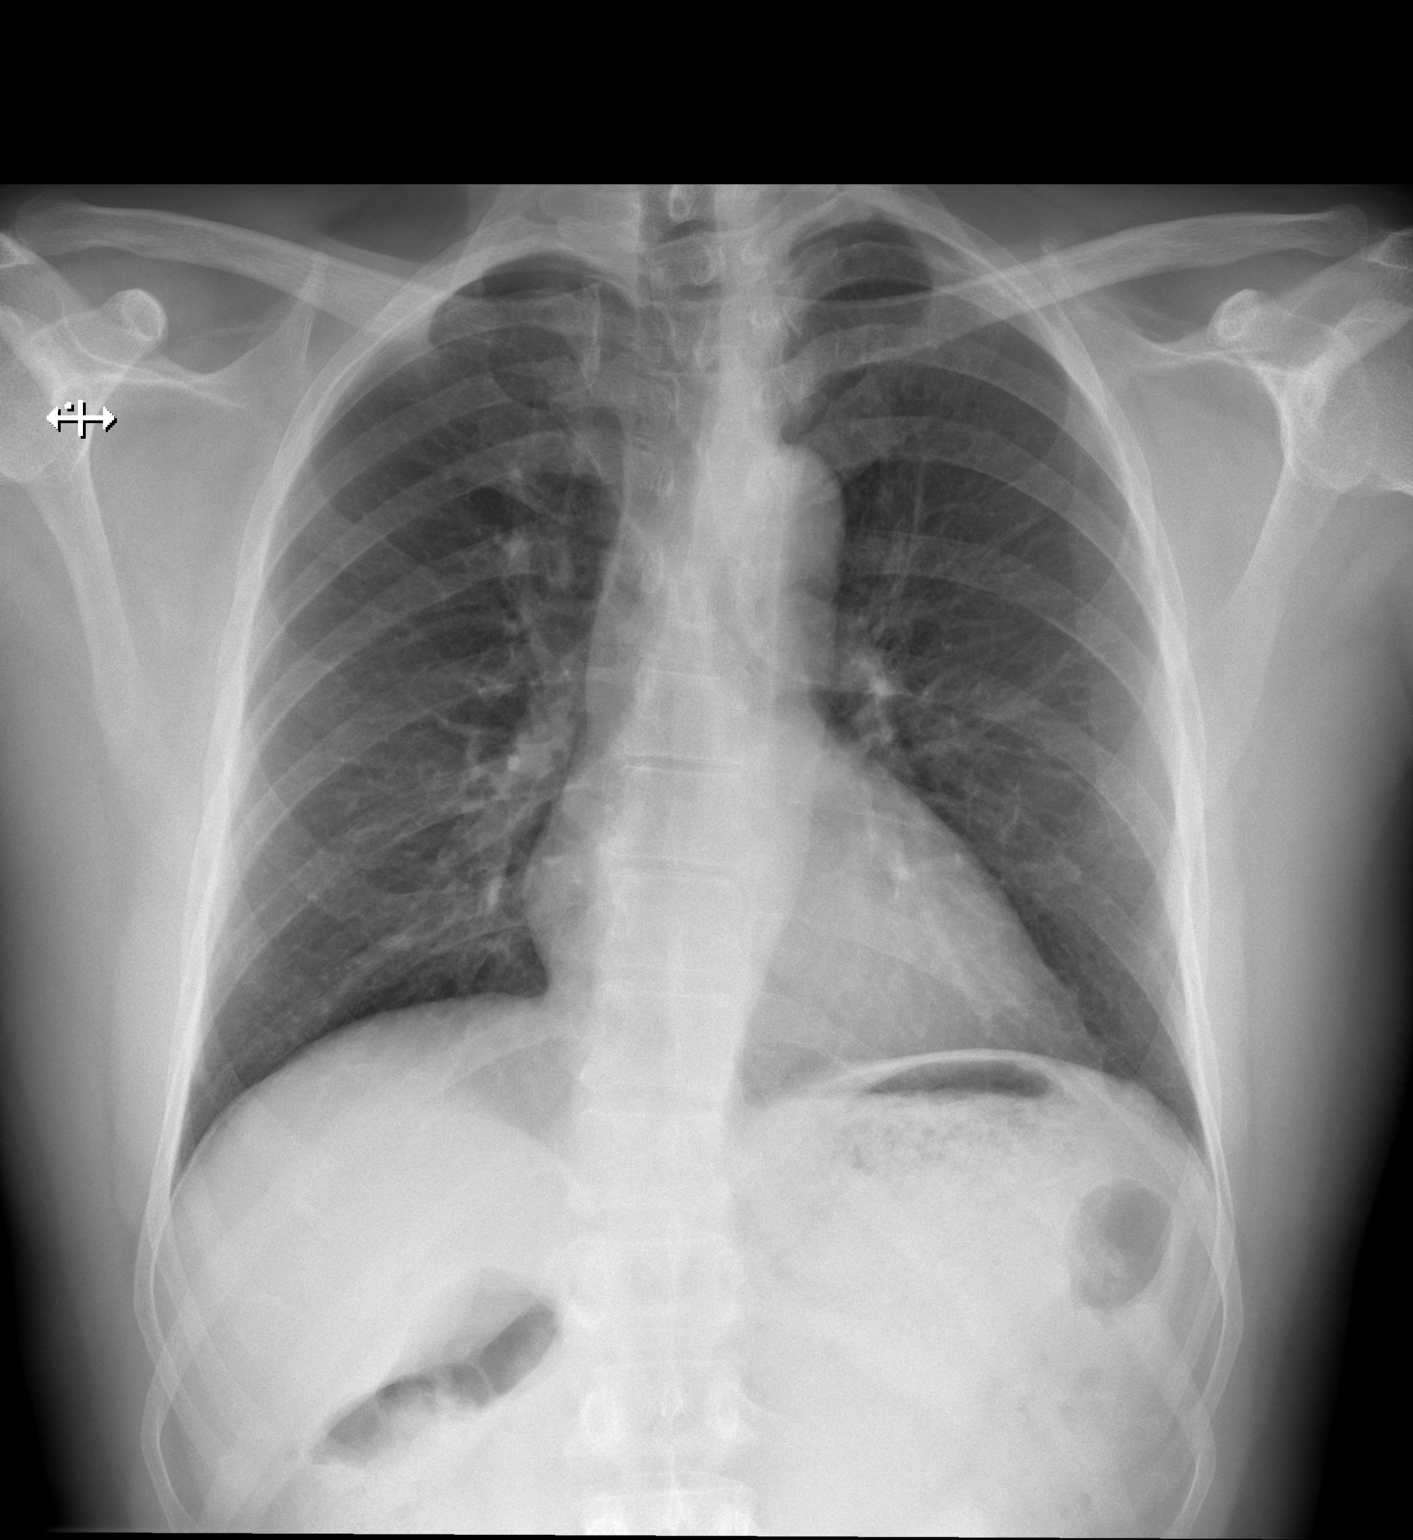

[w chest lat]
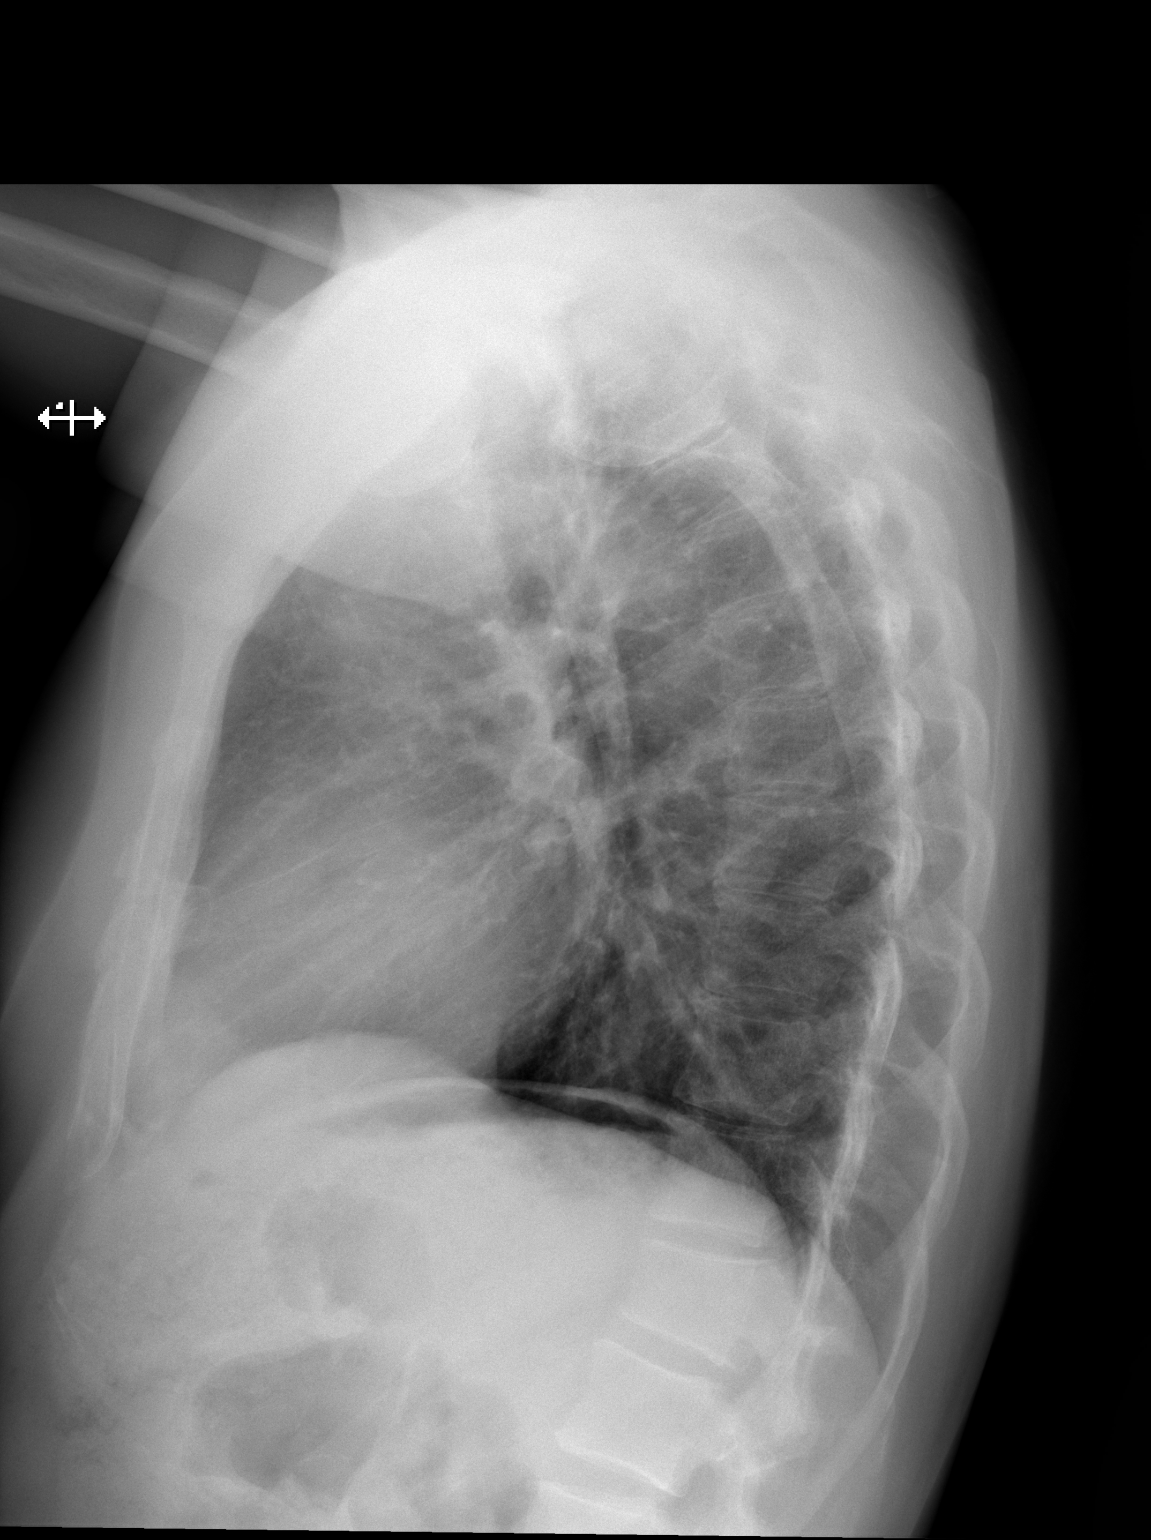

[2 of 2 positions shown; findings below may reference images not displayed]

FINDINGS: The heart size and mediastinal contours are within normal limits.
Both lungs are clear. The visualized skeletal structures are
unremarkable.
IMPRESSION: No active cardiopulmonary disease.

## 2022-04-26 ENCOUNTER — Encounter: Payer: Self-pay | Admitting: Family Medicine

## 2022-04-26 ENCOUNTER — Ambulatory Visit (INDEPENDENT_AMBULATORY_CARE_PROVIDER_SITE_OTHER): Payer: No Typology Code available for payment source | Admitting: Family Medicine

## 2022-04-26 VITALS — BP 151/84 | HR 69 | Temp 98.1°F | Ht 66.0 in | Wt 153.4 lb

## 2022-04-26 DIAGNOSIS — R058 Other specified cough: Secondary | ICD-10-CM

## 2022-04-26 MED ORDER — PREDNISONE 10 MG PO TABS: ORAL_TABLET | ORAL | 0 refills | Status: DC

## 2022-04-26 MED ORDER — MONTELUKAST SODIUM 10 MG PO TABS: 10.0000 mg | ORAL_TABLET | Freq: Every day | ORAL | 3 refills | Status: DC

## 2022-04-26 NOTE — Progress Notes (Signed)
Chief Complaint  Patient presents with   Cough    Bedtime only    HPI  Patient presents today for Patient presents with upper respiratory congestion.It occurs every winter.  Rhinorrhea  if he doesn't take his Zyrtec. . It is clear. There is no sore throat. Patient reports coughing frequently when he lays down at night. Also feels dyspneic.  as well.  No sputum noted. There is no fever, chills or sweats. Onset was years ago. Gradually worsening. Allergist gave him montelukasl last year. Allergy to pollen, grass and dust. Has allergies to dust pollen and grasses  PMH: Smoking status noted ROS: Review of Systems  Constitutional:  Negative for fever.  HENT:  Negative for sneezing.   Respiratory:  Positive for cough and chest tightness.   Cardiovascular:  Negative for chest pain.  Musculoskeletal:  Negative for arthralgias.  Skin:  Negative for rash.     Objective: BP (!) 151/84   Pulse 69   Temp 98.1 F (36.7 C)   Ht '5\' 6"'$  (1.676 m)   Wt 153 lb 6.4 oz (69.6 kg)   SpO2 96%   BMI 24.76 kg/m  Gen: NAD, alert, cooperative with exam HEENT: NCAT, Nasal passages swollen,boggy CV: RRR, good S1/S2, no murmur Resp: Lungs CTA Ext: No edema, warm Neuro: Alert and oriented, No gross deficits  Assessment and plan:  1. Allergic cough     Meds ordered this encounter  Medications   predniSONE (DELTASONE) 10 MG tablet    Sig: Take 5 daily for 3 days followed by 4,3,2 and 1 for 3 days each.    Dispense:  45 tablet    Refill:  0    Pt. Saw an allergist last year. Allergic to dust, grass and pollen. He will follow up and consider allergen desensitization.  Follow up as needed.  Claretta Fraise, MD

## 2022-12-05 ENCOUNTER — Telehealth: Payer: Self-pay | Admitting: *Deleted

## 2022-12-05 DIAGNOSIS — E782 Mixed hyperlipidemia: Secondary | ICD-10-CM

## 2022-12-05 MED ORDER — MONTELUKAST SODIUM 10 MG PO TABS: 10.0000 mg | ORAL_TABLET | Freq: Every day | ORAL | 0 refills | Status: DC

## 2022-12-05 MED ORDER — ATORVASTATIN CALCIUM 40 MG PO TABS: 40.0000 mg | ORAL_TABLET | Freq: Every day | ORAL | 0 refills | Status: AC

## 2022-12-05 NOTE — Telephone Encounter (Signed)
Tried to get refill at [facility], needs script to go to [facility] per ins Located pharmacy in [place] and sent refill. Yearly appt was made for November.

## 2023-02-06 ENCOUNTER — Encounter: Payer: Self-pay | Admitting: Family Medicine

## 2023-02-06 ENCOUNTER — Ambulatory Visit (INDEPENDENT_AMBULATORY_CARE_PROVIDER_SITE_OTHER): Payer: No Typology Code available for payment source | Admitting: Family Medicine

## 2023-02-06 VITALS — BP 122/82 | HR 76 | Temp 97.8°F | Ht 66.0 in | Wt 151.8 lb

## 2023-02-06 DIAGNOSIS — E782 Mixed hyperlipidemia: Secondary | ICD-10-CM | POA: Diagnosis not present

## 2023-02-06 DIAGNOSIS — Z0001 Encounter for general adult medical examination with abnormal findings: Secondary | ICD-10-CM | POA: Diagnosis not present

## 2023-02-06 DIAGNOSIS — E291 Testicular hypofunction: Secondary | ICD-10-CM | POA: Diagnosis not present

## 2023-02-06 DIAGNOSIS — Z0000 Encounter for general adult medical examination without abnormal findings: Secondary | ICD-10-CM

## 2023-02-06 LAB — URINALYSIS
Bilirubin, UA: NEGATIVE
Glucose, UA: NEGATIVE
Ketones, UA: NEGATIVE
Leukocytes,UA: NEGATIVE
Nitrite, UA: NEGATIVE
Protein,UA: NEGATIVE
Specific Gravity, UA: 1.015 (ref 1.005–1.030)
Urobilinogen, Ur: 0.2 mg/dL (ref 0.2–1.0)
pH, UA: 6.5 (ref 5.0–7.5)

## 2023-02-06 MED ORDER — MONTELUKAST SODIUM 10 MG PO TABS: 10.0000 mg | ORAL_TABLET | Freq: Every day | ORAL | 3 refills | Status: AC

## 2023-02-06 MED ORDER — MOMETASONE FUROATE 50 MCG/ACT NA SUSP: 2.0000 | Freq: Every day | NASAL | Status: AC

## 2023-02-06 NOTE — Progress Notes (Signed)
Subjective:  Patient ID: Jesse Thomas, male    DOB: [birth year]  Age: 57 y.o. MRN: [id number]  CC: Annual Exam   HPI Jesse Thomas presents for CPE.      02/06/2023    9:24 AM 02/06/2023    9:04 AM 04/26/2022   10:49 AM  Depression screen PHQ 2/9  Decreased Interest 2 0 0  Down, Depressed, Hopeless 0 0 0  PHQ - 2 Score 2 0 0  Altered sleeping 2    Tired, decreased energy 2    Change in appetite 0    Feeling bad or failure about yourself  0    Trouble concentrating 0    Moving slowly or fidgety/restless 0    Suicidal thoughts 0    PHQ-9 Score 6    Difficult doing work/chores Not difficult at all      History Jesse Thomas has a past medical history of History of kidney stones, Hyperlipidemia, and Hypogonadism male.   He has a past surgical history that includes none; Colonoscopy (N/A, 01/17/2018); and polypectomy (01/17/2018).   His family history includes Dementia in his mother; Early death in his father.He reports that he has never smoked. He has never used smokeless tobacco. He reports that he does not drink alcohol and does not use drugs.    ROS Review of Systems  Constitutional:  Negative for activity change, fatigue, fever and unexpected weight change.  HENT:  Positive for sneezing (relieved with zyrtec, alternating with allegra). Negative for congestion, ear pain, hearing loss, postnasal drip and trouble swallowing.   Eyes:  Negative for pain and visual disturbance.  Respiratory:  Positive for wheezing and stridor. Negative for cough, chest tightness and shortness of breath.   Cardiovascular:  Negative for chest pain, palpitations and leg swelling.  Gastrointestinal:  Negative for abdominal distention, abdominal pain, blood in stool, constipation, diarrhea, nausea and vomiting.  Endocrine: Negative for cold intolerance, heat intolerance and polydipsia.  Genitourinary:  Negative for difficulty urinating, dysuria, flank pain, frequency and urgency.  Musculoskeletal:   Negative for arthralgias and joint swelling.  Skin:  Negative for color change, rash and wound.  Allergic/Immunologic: Negative for food allergies.  Neurological:  Negative for dizziness, syncope, speech difficulty, weakness, light-headedness, numbness and headaches.  Hematological:  Does not bruise/bleed easily.  Psychiatric/Behavioral:  Negative for confusion, decreased concentration, dysphoric mood and sleep disturbance. The patient is not nervous/anxious.     Objective:  BP 122/82   Pulse 76   Temp 97.8 F (36.6 C)   Ht 5\' 6"  (1.676 m)   Wt 151 lb 12.8 oz (68.9 kg)   SpO2 96%   BMI 24.50 kg/m   BP Readings from Last 3 Encounters:  02/06/23 122/82  04/26/22 (!) 151/84  01/31/22 123/72    Wt Readings from Last 3 Encounters:  02/06/23 151 lb 12.8 oz (68.9 kg)  04/26/22 153 lb 6.4 oz (69.6 kg)  01/31/22 153 lb (69.4 kg)     Physical Exam    Assessment & Plan:   Jesse Thomas was seen today for annual exam.  Diagnoses and all orders for this visit:  Annual physical exam -     CBC with Differential/Platelet -     CMP14+EGFR -     Lipid panel -     PSA, total and free -     Urinalysis -     VITAMIN D 25 Hydroxy (Vit-D Deficiency, Fractures)  Mixed hyperlipidemia -     Lipid panel  Hypogonadism in male  Other  orders -     montelukast (SINGULAIR) 10 MG tablet; Take 1 tablet (10 mg total) by mouth at bedtime. -     mometasone (NASONEX) 50 MCG/ACT nasal spray; Place 2 sprays into the nose daily. Into each nostril       I have discontinued Jesse Thomas's predniSONE. I am also having him start on mometasone. Additionally, I am having him maintain his atorvastatin and montelukast.  Allergies as of 02/06/2023   No Known Allergies      Medication List        Accurate as of February 06, 2023 10:04 AM. If you have any questions, ask your nurse or doctor.          STOP taking these medications    predniSONE 10 MG tablet Commonly known as:  DELTASONE Stopped by: Denine Brotz       TAKE these medications    atorvastatin 40 MG tablet Commonly known as: LIPITOR Take 1 tablet (40 mg total) by mouth daily.   mometasone 50 MCG/ACT nasal spray Commonly known as: Nasonex Place 2 sprays into the nose daily. Into each nostril Started by: Ileen Kahre   montelukast 10 MG tablet Commonly known as: SINGULAIR Take 1 tablet (10 mg total) by mouth at bedtime.         Follow-up: Return in about 1 year (around 02/06/2024).  Mechele Claude, M.D.

## 2023-02-07 LAB — CMP14+EGFR
ALT: 35 IU/L (ref 0–44)
AST: 23 [IU]/L (ref 0–40)
Albumin: 4.5 g/dL (ref 3.8–4.9)
Alkaline Phosphatase: 112 IU/L (ref 44–121)
BUN/Creatinine Ratio: 15 (ref 9–20)
BUN: 16 mg/dL (ref 6–24)
Bilirubin Total: 0.7 mg/dL (ref 0.0–1.2)
CO2: 27 mmol/L (ref 20–29)
Calcium: 10.2 mg/dL (ref 8.7–10.2)
Chloride: 104 mmol/L (ref 96–106)
Creatinine, Ser: 1.1 mg/dL (ref 0.76–1.27)
Globulin, Total: 2.6 g/dL (ref 1.5–4.5)
Glucose: 102 mg/dL — ABNORMAL HIGH (ref 70–99)
Potassium: 4.4 mmol/L (ref 3.5–5.2)
Sodium: 144 mmol/L (ref 134–144)
Total Protein: 7.1 g/dL (ref 6.0–8.5)
eGFR: 79 mL/min/{1.73_m2} (ref >59)

## 2023-02-07 LAB — CBC WITH DIFFERENTIAL/PLATELET
Basophils Absolute: 0.1 10*3/uL (ref 0.0–0.2)
Basos: 2 %
EOS (ABSOLUTE): 0.6 10*3/uL — ABNORMAL HIGH (ref 0.0–0.4)
Eos: 12 %
Hematocrit: 46.7 % (ref 37.5–51.0)
Hemoglobin: 16 g/dL (ref 13.0–17.7)
Immature Grans (Abs): 0 10*3/uL (ref 0.0–0.1)
Immature Granulocytes: 0 %
Lymphocytes Absolute: 1.1 10*3/uL (ref 0.7–3.1)
Lymphs: 24 %
MCH: 29.7 pg (ref 26.6–33.0)
MCHC: 34.3 g/dL (ref 31.5–35.7)
MCV: 87 fL (ref 79–97)
Monocytes Absolute: 0.5 10*3/uL (ref 0.1–0.9)
Monocytes: 10 %
Neutrophils Absolute: 2.5 10*3/uL (ref 1.4–7.0)
Neutrophils: 52 %
Platelets: 225 10*3/uL (ref 150–450)
RBC: 5.39 x10E6/uL (ref 4.14–5.80)
RDW: 14.5 % (ref 11.6–15.4)
WBC: 4.8 10*3/uL (ref 3.4–10.8)

## 2023-02-07 LAB — LIPID PANEL
Chol/HDL Ratio: 3.9 ratio (ref 0.0–5.0)
Cholesterol, Total: 144 mg/dL (ref 100–199)
HDL: 37 mg/dL — ABNORMAL LOW (ref >39)
LDL Chol Calc (NIH): 77 mg/dL (ref 0–99)
Triglycerides: 175 mg/dL — ABNORMAL HIGH (ref 0–149)
VLDL Cholesterol Cal: 30 mg/dL (ref 5–40)

## 2023-02-07 LAB — PSA, TOTAL AND FREE
PSA, Free Pct: 67 %
PSA, Free: 0.67 ng/mL
Prostate Specific Ag, Serum: 1 ng/mL (ref 0.0–4.0)

## 2023-02-07 LAB — VITAMIN D 25 HYDROXY (VIT D DEFICIENCY, FRACTURES): Vit D, 25-Hydroxy: 70 ng/mL (ref 30.0–100.0)

## 2023-02-07 NOTE — Progress Notes (Signed)
Hello Jesse Thomas,  Your lab result is normal and/or stable.Some minor variations that are not significant are commonly marked abnormal, but do not represent any medical problem for you.  Best regards, Vedanshi Massaro, M.D.

## 2023-03-28 ENCOUNTER — Encounter: Payer: Self-pay | Admitting: Family Medicine

## 2023-03-28 ENCOUNTER — Ambulatory Visit (INDEPENDENT_AMBULATORY_CARE_PROVIDER_SITE_OTHER): Payer: No Typology Code available for payment source

## 2023-03-28 VITALS — BP 139/85 | HR 79 | Temp 98.3°F | Ht 66.0 in | Wt 150.0 lb

## 2023-03-28 DIAGNOSIS — J208 Acute bronchitis due to other specified organisms: Secondary | ICD-10-CM | POA: Diagnosis not present

## 2023-03-28 DIAGNOSIS — J069 Acute upper respiratory infection, unspecified: Secondary | ICD-10-CM

## 2023-03-28 DIAGNOSIS — B9689 Other specified bacterial agents as the cause of diseases classified elsewhere: Secondary | ICD-10-CM

## 2023-03-28 LAB — VERITOR FLU A/B WAIVED
Influenza A: NEGATIVE
Influenza B: NEGATIVE

## 2023-03-28 LAB — RSV AG, IMMUNOCHR, WAIVED: RSV Ag, Immunochr, Waived: NEGATIVE

## 2023-03-28 MED ORDER — AZITHROMYCIN 250 MG PO TABS: ORAL_TABLET | ORAL | 0 refills | Status: AC

## 2023-03-28 NOTE — Progress Notes (Signed)
 Subjective: CC: URI PCP: Zollie Lowers, MD YEP:Jesse Thomas is a 57 y.o. male presenting to clinic today for:  1.  URI Patient reports onset of productive cough with brownish sputum, subjective fevers, myalgia a few days ago.  He notes that it actually seems a little bit better today where the cough has gotten a little milder.  He has not had any wheezing, shortness of breath.  He is not a tobacco user nor is exposed to any tobacco.  He has no known sick contacts.  Using NyQuil, DayQuil another OTC for management of symptoms   ROS: Per HPI  No Known Allergies Past Medical History:  Diagnosis Date   History of kidney stones    Hyperlipidemia    Hypogonadism male     Current Outpatient Medications:    atorvastatin  (LIPITOR) 40 MG tablet, Take 1 tablet (40 mg total) by mouth daily., Disp: 365 tablet, Rfl: 0   mometasone  (NASONEX ) 50 MCG/ACT nasal spray, Place 2 sprays into the nose daily. Into each nostril, Disp: , Rfl:    montelukast  (SINGULAIR ) 10 MG tablet, Take 1 tablet (10 mg total) by mouth at bedtime., Disp: 90 tablet, Rfl: 3 Social History   Socioeconomic History   Marital status: Married    Spouse name: Not on file   Number of children: Not on file   Years of education: Not on file   Highest education level: Not on file  Occupational History   Not on file  Tobacco Use   Smoking status: Never   Smokeless tobacco: Never  Vaping Use   Vaping status: Never Used  Substance and Sexual Activity   Alcohol use: Never   Drug use: Never   Sexual activity: Yes    Birth control/protection: None  Other Topics Concern   Not on file  Social History Narrative   Not on file   Social Drivers of Health   Financial Resource Strain: Not on file  Food Insecurity: No Food Insecurity (03/02/2021)   Received from [facility], [facility]   Hunger Vital Sign    Worried About Running Out of Food in the Last Year: Never true    Ran Out of Food in the Last Year: Never true   Transportation Needs: Not on file  Physical Activity: Not on file  Stress: Not on file  Social Connections: Unknown (08/10/2021)   Received from [facility], [facility]   Social Network    Social Network: Not on file  Intimate Partner Violence: Unknown (07/02/2021)   Received from [facility], [facility]   HITS    Physically Hurt: Not on file    Insult or Talk Down To: Not on file    Threaten Physical Harm: Not on file    Scream or Curse: Not on file   Family History  Problem Relation Age of Onset   Dementia Mother    Early death Father     Objective: Office vital signs reviewed. Ht 5' 6 (1.676 m)   BMI 24.50 kg/m   Physical Examination:  General: Awake, alert, well nourished, No acute distress HEENT: Normal    Neck: No masses palpated. No lymphadenopathy    Ears: Tympanic membranes intact, normal light reflex, no erythema, no bulging    Eyes: PERRLA, extraocular membranes intact, sclera white    Nose: nasal turbinates moist, clear nasal discharge    Throat: moist mucus membranes, mild oropharyngeal erythema, no tonsillar exudate.  Airway is patent Cardio: regular rate and rhythm, S1S2 heard,  no murmurs appreciated Pulm: Mildly reduced breath sounds in the left lower lung field compared to the rest.  No wheezes, rhonchi or rales however with normal work of breathing on room air and no observed coughing    Assessment/ Plan: 57 y.o. male   Acute bacterial bronchitis - Plan: azithromycin  (ZITHROMAX ) 250 MG tablet  URI with cough and congestion - Plan: RSV Ag, Immunochr, Waived, Veritor Flu A/B Waived, Novel Coronavirus, NAA ([facility])  Given reports of brown sputum, will empirically treat for presumed bacterial infection.  Testing RSV negative and flu negative here in office.  Supportive care okay to continue.  Follow-up as needed   Norene CHRISTELLA Fielding, DO [facility] [phone]

## 2023-03-29 LAB — NOVEL CORONAVIRUS, NAA: SARS-CoV-2, NAA: NOT DETECTED

## 2023-10-09 ENCOUNTER — Telehealth: Payer: Self-pay

## 2023-10-09 NOTE — Telephone Encounter (Signed)
 Copied from CRM [id number]. Topic: Clinical - Prescription Issue >> Oct 09, 2023  1:59 PM Emylou G wrote: Reason for CRM: Delon w/[facility] called.. said patient insurance changed, so they can no longer fill the following:  atorvastatin  (LIPITOR) 40 MG tablet montelukast  (SINGULAIR ) 10 MG tablet It's on autorenew so they are just letting us  know

## 2023-10-09 NOTE — Telephone Encounter (Signed)
Pt. Needs to be seen for this. Thanks, WS
